# Patient Record
Sex: Female | Born: 1970 | Race: Black or African American | Hispanic: No | Marital: Married | Smoking: Never smoker
Health system: Southern US, Community
[De-identification: ages and names within clinical notes are randomized; demographics above are authoritative.]

## PROBLEM LIST (undated history)

## (undated) DIAGNOSIS — L309 Dermatitis, unspecified: Secondary | ICD-10-CM

## (undated) DIAGNOSIS — Z8639 Personal history of other endocrine, nutritional and metabolic disease: Secondary | ICD-10-CM

## (undated) HISTORY — PX: WISDOM TOOTH EXTRACTION: SHX21

## (undated) HISTORY — PX: HERNIA REPAIR: SHX51

---

## 2000-08-27 ENCOUNTER — Ambulatory Visit (HOSPITAL_COMMUNITY): Admission: RE | Admit: 2000-08-27 | Discharge: 2000-08-27 | Payer: Self-pay | Admitting: Obstetrics and Gynecology

## 2000-08-27 ENCOUNTER — Encounter: Payer: Self-pay | Admitting: Obstetrics and Gynecology

## 2000-11-15 ENCOUNTER — Encounter: Admission: RE | Admit: 2000-11-15 | Discharge: 2001-02-13 | Payer: Self-pay | Admitting: Obstetrics and Gynecology

## 2000-12-10 ENCOUNTER — Encounter (HOSPITAL_COMMUNITY): Admission: AD | Admit: 2000-12-10 | Discharge: 2001-01-09 | Payer: Self-pay | Admitting: Obstetrics and Gynecology

## 2001-01-16 ENCOUNTER — Encounter (HOSPITAL_COMMUNITY): Admission: RE | Admit: 2001-01-16 | Discharge: 2001-01-24 | Payer: Self-pay | Admitting: Obstetrics & Gynecology

## 2001-01-24 ENCOUNTER — Inpatient Hospital Stay (HOSPITAL_COMMUNITY): Admission: AD | Admit: 2001-01-24 | Discharge: 2001-01-27 | Payer: Self-pay | Admitting: Obstetrics and Gynecology

## 2001-03-10 ENCOUNTER — Other Ambulatory Visit: Admission: RE | Admit: 2001-03-10 | Discharge: 2001-03-10 | Payer: Self-pay | Admitting: Obstetrics and Gynecology

## 2002-03-12 ENCOUNTER — Other Ambulatory Visit: Admission: RE | Admit: 2002-03-12 | Discharge: 2002-03-12 | Payer: Self-pay | Admitting: Family Medicine

## 2003-02-16 ENCOUNTER — Other Ambulatory Visit: Admission: RE | Admit: 2003-02-16 | Discharge: 2003-02-16 | Payer: Self-pay | Admitting: Family Medicine

## 2005-03-28 ENCOUNTER — Other Ambulatory Visit: Admission: RE | Admit: 2005-03-28 | Discharge: 2005-03-28 | Payer: Self-pay | Admitting: Obstetrics and Gynecology

## 2005-06-18 ENCOUNTER — Encounter: Admission: RE | Admit: 2005-06-18 | Discharge: 2005-09-16 | Payer: Self-pay | Admitting: Obstetrics and Gynecology

## 2005-08-22 ENCOUNTER — Inpatient Hospital Stay (HOSPITAL_COMMUNITY): Admission: AD | Admit: 2005-08-22 | Discharge: 2005-08-24 | Payer: Self-pay | Admitting: Obstetrics and Gynecology

## 2005-10-13 ENCOUNTER — Inpatient Hospital Stay (HOSPITAL_COMMUNITY): Admission: AD | Admit: 2005-10-13 | Discharge: 2005-10-15 | Payer: Self-pay | Admitting: Obstetrics and Gynecology

## 2007-07-05 IMAGING — US US OB COMP +14 WK
1 series · 13 of 28 positions shown · non-contrast
Comparison: none

CLINICAL DATA: 34-year-old female.  31 weeks pregnant.  Pain.  Patient hurts when standing.

[Series 1: us ob comp +14 wk · 0.39mm/px · 37 acquisitions, 13 frames shown]
[im 2/37]
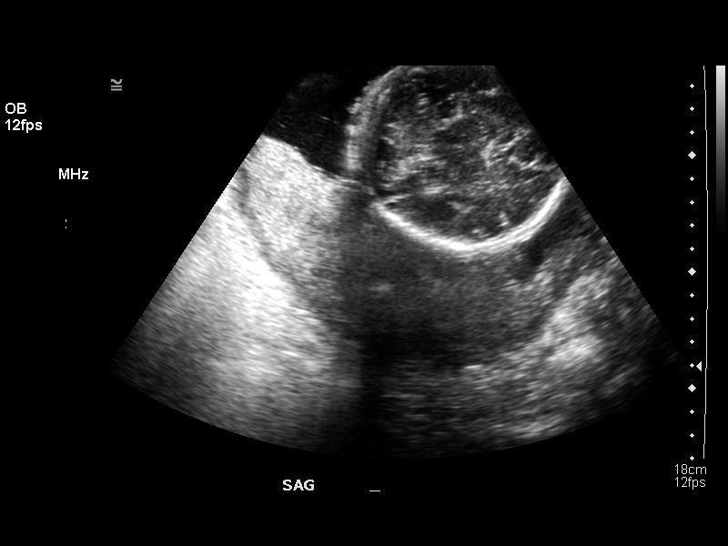
[im 5/37]
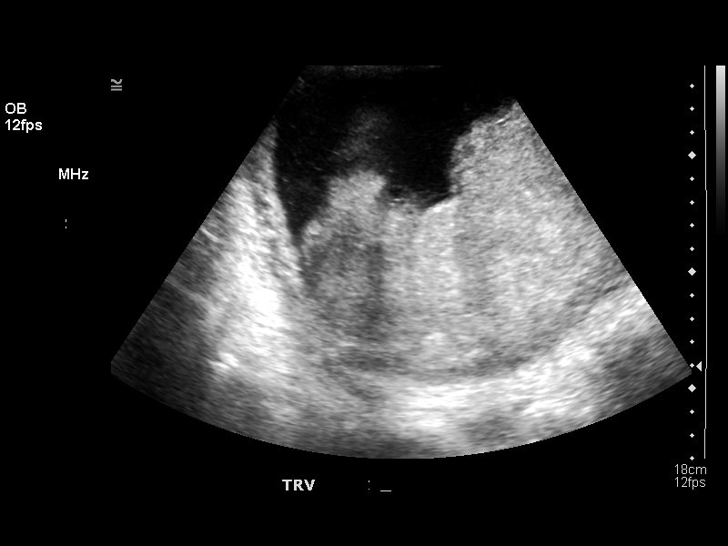
[im 7/37]
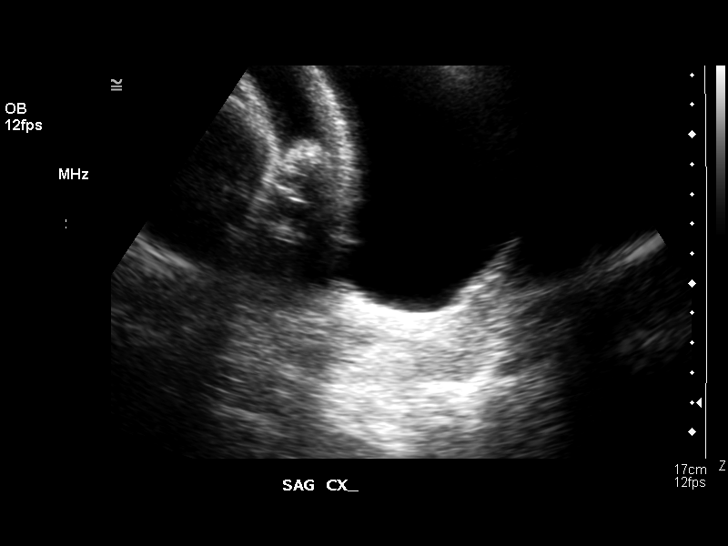
[im 10/37]
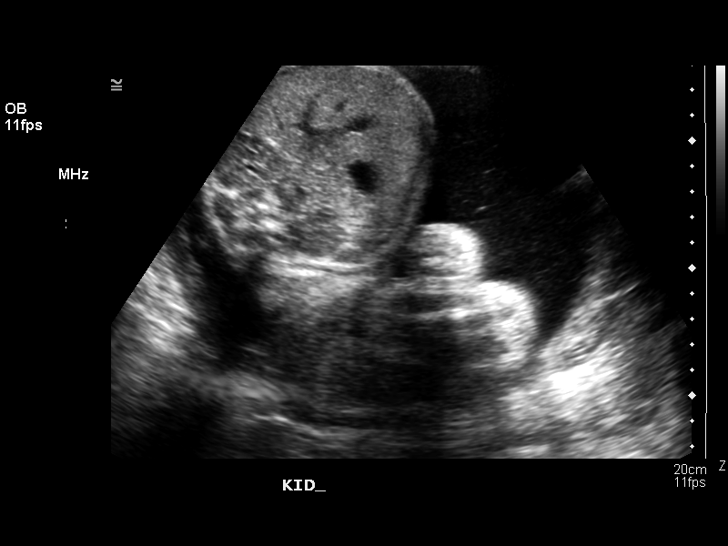
[im 13/37]
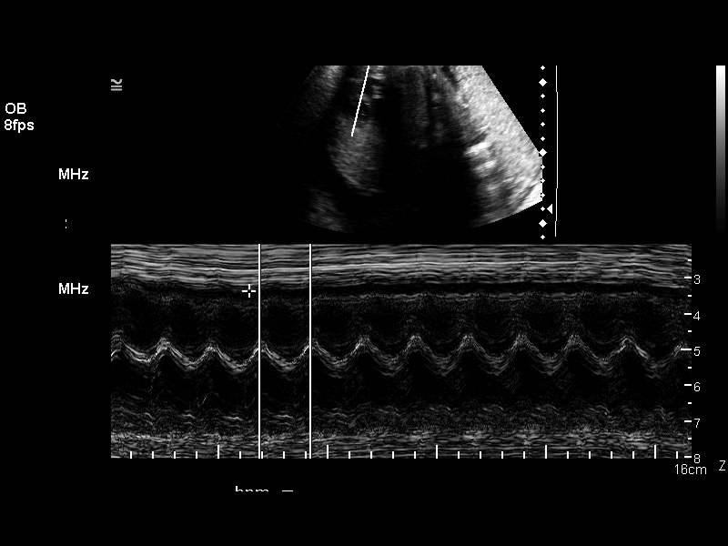
[im 15/37]
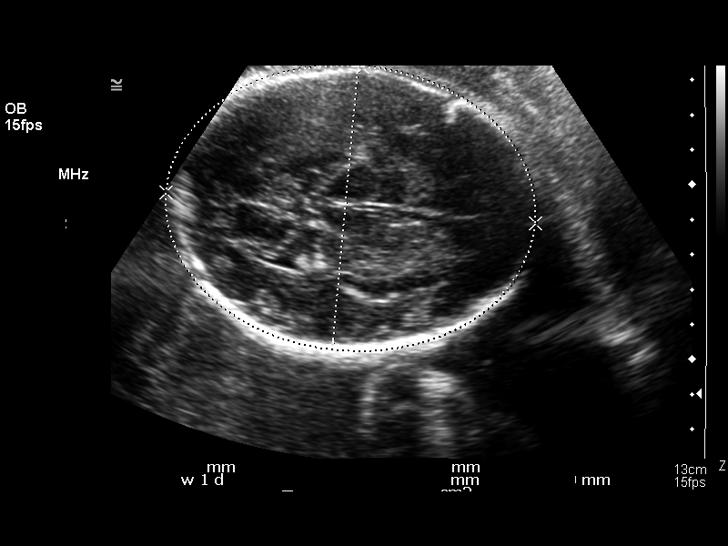
[im 19/37]
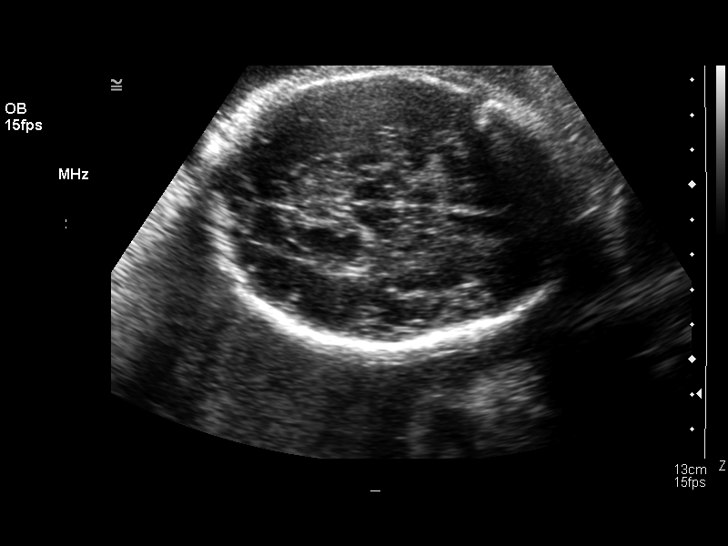
[im 22/37]
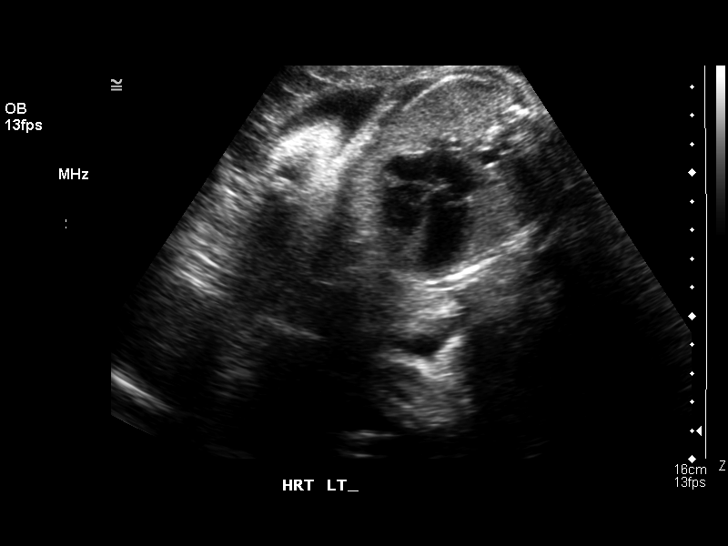
[im 25/37]
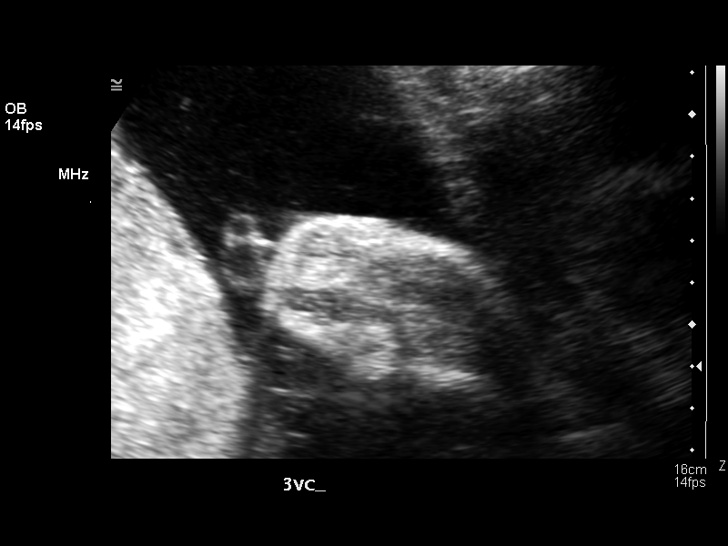
[im 27/37]
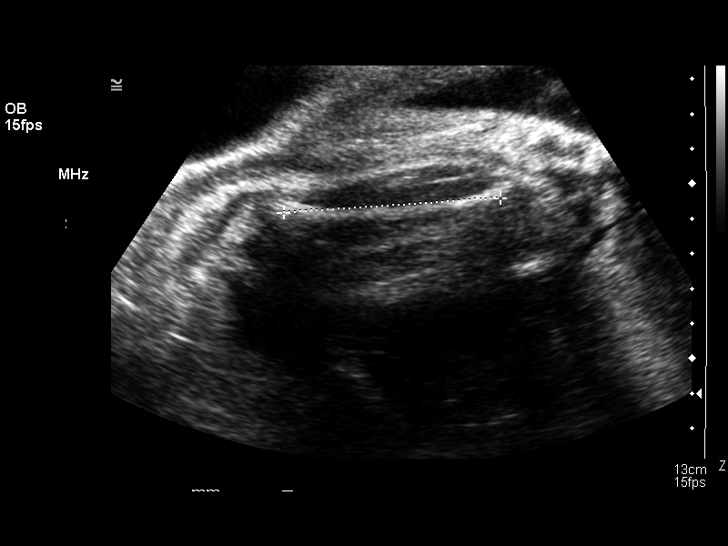
[im 30/37]
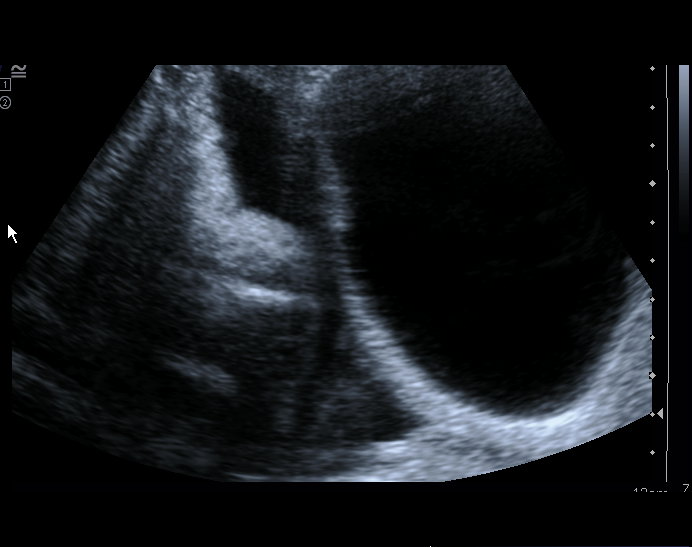
[im 33/37]
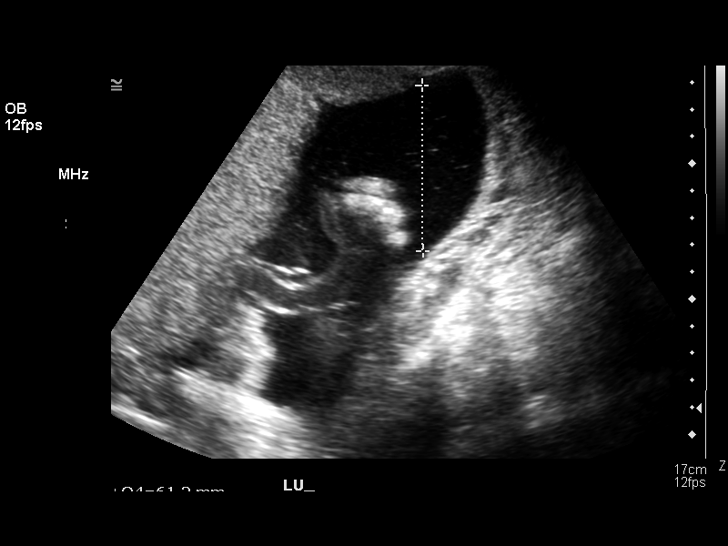
[im 35/37]
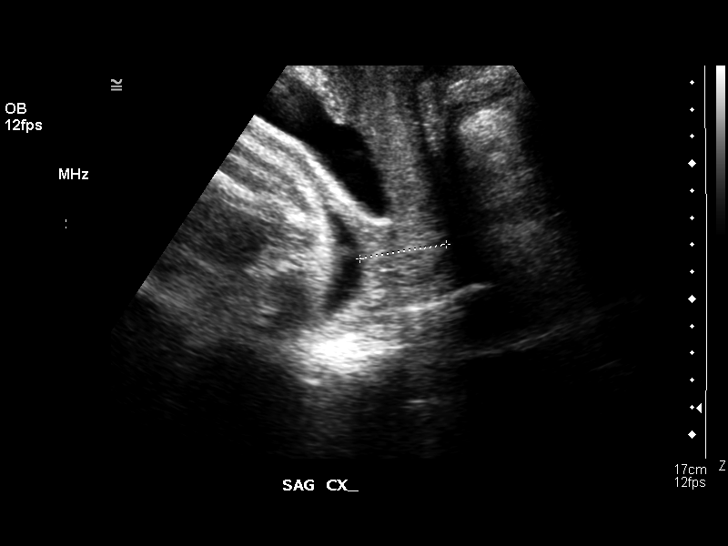

[13 of 28 positions shown; findings below may reference images not displayed]

OBSTETRICAL ULTRASOUND:
 Number of Fetuses: 1
 Heart Rate:  128
 Movement:  Yes
 Breathing:  Yes  
 Presentation:  Variable
 Placental Location:  Fundal, posterior
 Grade: I
 Previa:  No
 Amniotic Fluid (Subjective):  Normal 
 Amniotic Fluid (Objective):   15.7 cm AFI (5th -95th%ile = 8.6 ? 24.2 cm for 32 wks)

 FETAL BIOMETRY
 BPD:   7.9 cm  32 w 0 d
 HC:   29.4 cm  32 w 3 d
 AC:   28.8 cm   32 w 6 d
 FL:   6.2 cm  32 w 0 d

 MEAN GA:  32 w 2 d  US EDC:  10/16/05

 EFW:  5634 g (H) 75th ? 90th%ile (8022 ? 2970 g) For 32 wks

 FETAL ANATOMY
 Lateral Ventricles:    Visualized 
 Thalami/CSP:      Visualized 
 Posterior Fossa:  Visualized 
 Nuchal Region:    N/A
 Spine:      Not visualized 
 4 Chamber Heart on Left:      Visualized 
 Stomach on Left:      Visualized 
 3 Vessel Cord:    Visualized 
 Cord Insertion site:    Not visualized 
 Kidneys:  Visualized 
 Bladder:  Visualized 
 Extremities:      Not visualized 

 Evaluation limited by:  Fetal position and advanced gestational age.

 MATERNAL UTERINE AND ADNEXAL FINDINGS
 Cervix:   3.2 cm Translabially
IMPRESSION: 1.  Single intrauterine pregnancy with fetal heart rate of 128 bpm.  
 2.  Cervix is closed and AFI is within normal limits.

## 2009-02-02 ENCOUNTER — Encounter: Admission: RE | Admit: 2009-02-02 | Discharge: 2009-02-02 | Payer: Self-pay | Admitting: Obstetrics and Gynecology

## 2009-04-08 ENCOUNTER — Inpatient Hospital Stay (HOSPITAL_COMMUNITY): Admission: AD | Admit: 2009-04-08 | Discharge: 2009-04-08 | Payer: Self-pay | Admitting: Obstetrics and Gynecology

## 2009-04-22 ENCOUNTER — Ambulatory Visit (HOSPITAL_COMMUNITY): Admission: RE | Admit: 2009-04-22 | Discharge: 2009-04-22 | Payer: Self-pay | Admitting: Obstetrics and Gynecology

## 2009-05-26 ENCOUNTER — Encounter (INDEPENDENT_AMBULATORY_CARE_PROVIDER_SITE_OTHER): Payer: Self-pay | Admitting: Obstetrics and Gynecology

## 2009-05-26 ENCOUNTER — Inpatient Hospital Stay (HOSPITAL_COMMUNITY): Admission: RE | Admit: 2009-05-26 | Discharge: 2009-05-29 | Payer: Self-pay | Admitting: Obstetrics and Gynecology

## 2009-11-11 ENCOUNTER — Other Ambulatory Visit: Admission: RE | Admit: 2009-11-11 | Discharge: 2009-11-11 | Payer: Self-pay | Admitting: *Deleted

## 2010-06-11 ENCOUNTER — Encounter: Payer: Self-pay | Admitting: Obstetrics and Gynecology

## 2010-08-05 LAB — GLUCOSE, CAPILLARY
Glucose-Capillary: 109 mg/dL — ABNORMAL HIGH (ref 70–99)
Glucose-Capillary: 134 mg/dL — ABNORMAL HIGH (ref 70–99)
Glucose-Capillary: 147 mg/dL — ABNORMAL HIGH (ref 70–99)
Glucose-Capillary: 85 mg/dL (ref 70–99)

## 2010-08-05 LAB — CBC
HCT: 38.4 % (ref 36.0–46.0)
Hemoglobin: 12.9 g/dL (ref 12.0–15.0)
MCHC: 33.3 g/dL (ref 30.0–36.0)
MCHC: 33.6 g/dL (ref 30.0–36.0)
MCV: 94.1 fL (ref 78.0–100.0)
MCV: 94.8 fL (ref 78.0–100.0)
Platelets: ADEQUATE 10*3/uL (ref 150–400)
RBC: 4.08 MIL/uL (ref 3.87–5.11)
RDW: 14.2 % (ref 11.5–15.5)
RDW: 14.2 % (ref 11.5–15.5)
WBC: 8.4 10*3/uL (ref 4.0–10.5)

## 2010-08-05 LAB — GLUCOSE, RANDOM: Glucose, Bld: 83 mg/dL (ref 70–99)

## 2010-08-05 LAB — RPR: RPR Ser Ql: NONREACTIVE

## 2010-08-23 LAB — GC/CHLAMYDIA PROBE AMP, GENITAL: GC Probe Amp, Genital: NEGATIVE

## 2010-08-23 LAB — STREP B DNA PROBE

## 2010-08-23 LAB — WET PREP, GENITAL
Trich, Wet Prep: NONE SEEN
Yeast Wet Prep HPF POC: NONE SEEN

## 2010-08-23 LAB — URINALYSIS, ROUTINE W REFLEX MICROSCOPIC
Bilirubin Urine: NEGATIVE
Glucose, UA: 100 mg/dL — AB
Hgb urine dipstick: NEGATIVE
Protein, ur: NEGATIVE mg/dL
Urobilinogen, UA: 0.2 mg/dL (ref 0.0–1.0)

## 2010-10-06 NOTE — Discharge Summary (Signed)
[name], [name]              ACCOUNT NO.:  [id number]   MEDICAL RECORD NO.:  [id number]          PATIENT TYPE:  INP   LOCATION:  9153                          FACILITY:  [facility]   PHYSICIAN:  Naima A. Dillard, M.D. DATE OF BIRTH:  08-15-1970   DATE OF ADMISSION:  08/22/2005  DATE OF DISCHARGE:  08/24/2005                                 DISCHARGE SUMMARY   ADMISSION DIAGNOSES:  1.  Intrauterine pregnancy at 31-6/7th weeks.  2.  Preterm contractions.  3.  Gestational diabetes, diet controlled.   DISCHARGE DIAGNOSES:  1.  Intrauterine pregnancy at 32-1/7th weeks.  2.  Preterm contractions, resolved.  3.  Gestational diabetes, now on insulin.   PROCEDURE:  Fetal heart rate monitoring, magnesium tocolysis.   HOSPITAL COURSE:  Brooke [name] is a 40 year old, gravida 2, para 1-0-0-1, who  presented with complaints of abdominal cramping on August 22, 2005.  At the  time of evaluation in maternity admissions, the patient was noted to be  having contractions every three minutes.  The patient was given terbutaline  without resolution of contractions.  Fetal fibronectin obtained in MAU was  negative.  The patient also received Procardia in MAU for preterm  contractions with little change in contraction pattern.  The patient's  cervix was long, closed and posterior on admission.  Due to continued  contractions despite terbutaline and Procardia, the patient was placed on  magnesium tocolysis.  Contractions resolved with magnesium tocolysis.  Fetal  heart rate status remained reassuring throughout her hospital stay.  The  patient was given a course of antenatal steroids throughout her  hospitalization.  The patient's blood sugars in the 170s to 190s after  steroids.  Therefore, the patient was started on insulin during her hospital  course.   Cervix remained long.  Ultrasound obtained during her hospital stay revealed  normal amniotic fluid, estimated fetal weight in the 75th to 90th  percentile.  Cervix closed 3.2 cm and transverse lie.  The patient's group B  strep obtained on admission pending.  Gonorrhea and Chlamydia cultures  obtained on admission were both negative.   The patient was weaned from magnesium with no recurrence of contractions on  August 24, 2005.  The patient was discharged home after discontinuation of  magnesium per tocolysis.  The patient's pregnancy has been remarkable for 1)  gestational diabetes, diet control; 2) advanced maternal age with the  patient declining gestational testing.   CONDITION ON DISCHARGE:  Stable.   DISCHARGE INSTRUCTIONS:  The patient is to be on modified bed rest at home.  The patient was given routine preterm labor precautions.   DISCHARGE MEDICATIONS:  1.  Prenatal vitamins daily.  2.  NovoLog insulin sliding scale.  The patient was given complete      instructions on insulin self-administration prior to discharge.   FOLLOW UP:  Discharge follow up with occur next week at [facility].  The patient is to call for an appointment.      Rhona Leavens, CNM      Naima A. Normand Sloop, M.D.  Electronically Signed  NOS/MEDQ  D:  08/25/2005  T:  08/25/2005  Job:  [id number]

## 2010-10-06 NOTE — H&P (Signed)
Brooke [name], Brooke [name]              ACCOUNT NO.:  [id number]   MEDICAL RECORD NO.:  [id number]          PATIENT TYPE:  MAT   LOCATION:  MATC                          [facility]:  [facility]   PHYSICIAN:  Naima A. Dillard, M.D. DATE OF BIRTH:  [birth year]   DATE OF ADMISSION:  08/22/2005  DATE OF DISCHARGE:                                HISTORY & PHYSICAL   Brooke [name] is a 40 year old gravida 2, para 1-0-0-1, at 69 6/7 weeks who  presented complaining of a tightness in her abdomen since approximately 1  p.m.  She denied vaginal bleeding, discharge, dysuria, nausea and vomiting,  or other symptoms.  She denies any recent intercourse.  Her pregnancy has  been remarkable for:  1.  Gestational diabetes diet controlled.  2.  Advanced maternal age with patient declining amniocentesis.   While at maternity admissions, the patient was noted to be contracting every  2-3 minutes initially.  Her cervix was long and closed.  Her fetal  fibronectin was negative, however, despite two doses of Terbutaline, the  patient continued to contract every 3-4 minutes.  Contractions were of mild  to moderate intensity.  The patient was, therefore, admitted for further  monitoring, Procardia 10 mg p.o. q.6h., and IV hydration.   Prenatal labs revealed blood type is A positive, RH antibody negative, VDRL  nonreactive, Rubella titer positive, hepatitis B surface antigen negative,  HIV negative, GC Chlamydia cultures were negative, Pap was normal.  Hemoglobin upon entry to the practice was 13.3.  She had a normal quadruple  screen.  She had an elevated Glucola and was diagnosed with gestational  diabetes.  She has been maintaining blood sugars at fasting less than 90 and  less than 130.   HISTORY OF PRESENT PREGNANCY:  Patient entered care at approximately 10  weeks.  She declined first trimester screening.  She initially declined  quadruple screening but then did have that.  She had an ultrasound at 18  weeks which  revealed normal growth and development except for three cord  plexus cyst, quadruple screening was normal, Glucola was given at 18 weeks  due to an elevation.  She also failed a three hour GGT.  Her fastings have  all remained within normal limits.  She had another ultrasound at 29 weeks  showing normal growth and development.  She had some bright red bleeding at  approximately 21 weeks.  The rest of her pregnancy has been uncomplicated.   OBSTETRICAL HISTORY:  In 2002, she had a vaginal birth of a female infant,  weight 6 pounds 12 ounces at 40 weeks.  She had epidural for labor.  She did  have gestational diabetes diet controlled during that pregnancy.   MEDICAL HISTORY:  She has not used any contraception since the birth of her  last child.  She reports the usual childhood illnesses.   ALLERGIES:  She has no known drug allergies.   FAMILY HISTORY:  Her maternal aunts x 2 had hypertension.  Maternal aunt  also had heart disease.  Her mother and maternal aunt had diabetes.   Her only other hospitalization  is for child birth.  Genetic history is  unremarkable except for advanced maternal age with the patient going to be  35 at the time of the baby's birth.   SOCIAL HISTORY:  The patient is married to the father of the baby, he is  involved and supportive, his name is Justa Hatchell.  The patient is high  school educated, she is a Futures trader.  Her husband is a Emergency planning/management officer with  [employer].  He has two years of college.  She has been followed by the  physician service at [facility].  She denies any alcohol, drug, or  tobacco use during this pregnancy.   PHYSICAL EXAMINATION:  VITAL SIGNS:  Stable, the patient is afebrile.  HEENT:  Within normal limits.  LUNGS:  Breath sounds are clear.  HEART:  Regular rate and rhythm without murmur.  BREASTS:  Soft and nontender.  ABDOMEN:  Fundal height is approximately 21 cm, uterine contractions every 3-  4 minutes, mild to moderate  quality.  PELVIC:  Cervical exam is closed, long, vertex is -2, vertex presentation is  verified by bedside ultrasound.  Wet prep is negative.  Chem-9 was negative.  GC Chlamydia, and group B strep were also done and are pending.  Fetal heart  rate is reactive with a baseline of 140 to 150 with accelerations noted.  EXTREMITIES:  Deep tendon reflexes are 2+ without clonus, there is a trace  edema noted.   Fasting blood sugar per patient report at 4:30 this afternoon was 115.   IMPRESSION:  1.  Intrauterine pregnancy at 31 6/7 weeks.  2.  Preterm labor with negative feto fibronectin.  3.  Gestational diabetes, diet controlled.   PLAN:  1.  Admit to [facility] per consult with Dr. Jaymes Graff, attending physician.  2.  Routine physician antenatal orders.  3.  Continuous electronic fetal monitoring.  4.  Fasting blood sugar and 2 hour PC.  5.  LR at 125 mL per hour after a 200 mL bolus.  6.  Procardia 10 mg p.o. q.6h.  7.  Carbohydrate restricted diet.  8.  Ambien 10 mg 1 p.o. q.h.s.  9.  MD to be notified with blood sugars greater than 130.      Renaldo Reel Emilee Hero, C.N.M.      Naima A. Normand Sloop, M.D.  Electronically Signed    VLL/MEDQ  D:  08/22/2005  T:  08/22/2005  Job:  [id number]

## 2010-10-06 NOTE — H&P (Signed)
Brooke [name], Brooke [name]              ACCOUNT NO.:  [id number]   MEDICAL RECORD NO.:  [id number]           PATIENT TYPE:   LOCATION:                                [facility]ILITY:  [facility]   PHYSICIAN:  Hal Morales, M.D.     DATE OF BIRTH:   DATE OF ADMISSION:  DATE OF DISCHARGE:                                HISTORY & PHYSICAL   DATE OF ADMISSION:  Oct 13, 2005   Brooke [name] is a 40 year old gravida 2, para 1-0-0-1 at 66 and two-sevenths  weeks who presents today for induction secondary to gestational diabetes,  diet controlled.  The pregnancy has been remarkable for:  1.  Gestational diabetes, diet controlled.  2.  Advanced maternal age with amniocentesis declined, normal quadruple      screen.   PRENATAL LABORATORY DATA:  Blood type is A positive, Rh antibody negative.  VDRL nonreactive.  Rubella titer positive.  Hepatitis B surface antigen  negative.  HIV nonreactive.  GC and chlamydia cultures were negative at the  first trimester and at 36 weeks.  Pap smear was normal.  Group B strep  culture was negative at [redacted] weeks along with GC and chlamydia.  Hemoglobin  upon entering the practice was 13.3; it was within normal limits at Glucola  visit.  EDC of Oct 18, 2005, was established by last menstrual period and  was in agreement with ultrasound at 18 weeks.   HISTORY OF PRESENT ILLNESS:  The patient entered care at approximately 10  weeks.  She declined amniocentesis.  She was considering tubal sterilization  at that time.  Quadruple screen was declined.  Glucola was given at 18  weeks.  She then decided to have the quadruple screen which was normal.  She  had an ultrasound at that time showing normal growth and development except  for choroid plexus cyst x2.  Cervix was within normal limits.  At 21 week  she had a Dextrostix that was elevated.  She had an ultrasound showing  normal fluid.  She had a small amount of bleeding at that time.  She was  offered monitoring for contractions  in maternity admissions unit.  The  patient preferred to go home.  She was seen the following day with no  further bleeding.  She did fail the 3-hour GTT that followed from her  elevated 1-hour GTT.  [facility].  CBGs on dietary management were within normal limits.  She had an ultrasound  at 29 weeks showing normal growth and development with normal fluid, growth  at the 42nd percentile.  Her capillary  blood glucose remained within normal  limits.  She had another episode of questionable spotting at 30 weeks.  She  was evaluated for this subsequently.  She did have a nabothian cyst noted  but the cervix was long and close without any evidence of bleeding.  She  then began to decide that she preferred Mirena postpartum versus a tubal.  At 31 weeks she began to have some slight elevations of her 2-hour  postprandial blood sugars.  She  also had mild thrombocytopenia.  This was  followed up again at 36 weeks.  Her platelets were 131.  Group B strep  culture was done at that time and was negative.  She had another ultrasound  at 34 weeks showing 75th to 90th percentile with a variable presentation.  She was placed on a sliding scale but rarely had to use the insulin at all  and in fact, by 35 weeks was not having to use it at all.  She did receive  betamethasone in the hospital when she was hospitalized for preterm labor at  31 weeks.  The rest of her pregnancy was essentially uncomplicated.   OBSTETRICAL HISTORY:  In 2002 the patient had a vaginal birth of a female  infant, weight 6 pounds 12 ounces at 40 weeks.  She was in labor 12 hours.  She had epidural anesthesia.  She did have gestational diabetes during that  pregnancy and was managed with dietary control.   MEDICAL HISTORY:  She had not use any contraception since her last child.  She reports the usual childhood illnesses.   SURGICAL HISTORY:  Includes her wisdom teeth removed at age 50.  Her  only  other hospitalization was for childbirth.   FAMILY HISTORY:  Maternal aunt had heart disease.  Maternal aunts x2 had  hypertension.  Her mother and two maternal aunts had diabetes.   GENETIC HISTORY:  Remarkable for advanced maternal age with the patient  declining amniocentesis but had a normal quadruple screen.   SOCIAL HISTORY:  The patient is married to the father of the baby.  He is  involved and supportive.  His name is Brooke [name].  The patient is high-  school educated; she is a Futures trader.  Her husband has 2 years of college.  He is Emergency planning/management officer with [employer].  She has been followed by the  physician service at [facility].  She denies any alcohol, drug, or  tobacco use during this pregnancy.   PHYSICAL EXAMINATION:  VITAL SIGNS:  Stable, the patient is afebrile.  HEENT:  Within normal limits.  LUNGS:  Bilateral breath sounds are clear.  HEART:  Regular rate and rhythm without murmur.  BREASTS:  Soft and nontender.  ABDOMEN:  Fundal height is approximately 39 cm.  Estimated fetal weight is 7-  8 pounds.  Uterine contractions are very occasional and mild.  Fetal heart  rate has been reactive on NSTs in the office.  PELVIC:  Exam is deferred at this time.  This will be performed by the nurse  or by Dr. Pennie Rushing prior to induction.  EXTREMITIES:  Deep tendon reflexes are 2+ without clonus.  There is a trace  edema noted.   IMPRESSION:  1.  Intrauterine pregnancy at 40 and two-sevenths weeks.  2.  Gestational diabetes.  3.  Negative group B streptococcus.   PLAN:  1.  Admit to birthing suite per consult with Dr. Dierdre Forth as      attending physician.  2.  Routine physician orders.  3.  Dr. Pennie Rushing will determine the plan of induction for this patient.      Renaldo Reel Brooke [name], C.N.M.      Hal Morales, M.D.  Electronically Signed    VLL/MEDQ  D:  10/12/2005  T:  10/12/2005  Job:  [id number]

## 2010-10-06 NOTE — Discharge Summary (Signed)
[facility]  Patient:    Brooke [name], Brooke [name] Visit Number: [id number] MRN: [id number]          Service Type: OBS Location: [id number] Attending Physician:  Oliver Pila Dictated by:   Alvino Chapel, M.D. Admit Date:  01/24/2001 Discharge Date: 01/27/2001                             Discharge Summary  DISCHARGE DIAGNOSES:          1. Term pregnancy at 39+ weeks.                               2. A1 diabetes mellitus.                               3. Meconium-stained fluid.                               4. Shoulder dystocia.                               5. Vacuum-assisted vaginal delivery.  DISCHARGE MEDICATIONS:        Motrin 600 mg p.o. every six hours p.r.n.  DISCHARGE FOLLOWUP:           The patient is to follow up in approximately six weeks for her routine postpartum exam.  REASON FOR ADMISSION:         The patient is a 40 year old, G1, P0, who was admitted at 39 weeks after she presented to labor and delivery with vaginal bleeding, passing one small clot and period-like flow. The patient, on admission, did have contractions every three minutes, fetal heart rate was reactive and cervical exam revealed a cervix that was 70%, 1+ cm dilated, and a -2 station. Given the vaginal bleeding, the patient was admitted for augmentation of labor and observation of fetal heart rate. Pregnancy had been complicated by A1 diabetes which was well controlled and otherwise was normal.   PRENATAL LABORATORY DATA:     A positive, antibody negative, sickle negative, RPR nonreactive, rubella immune, hepatitis B surface antigen negative, HIV negative, GC negative, Chlamydia negative, GBS negative.  PAST OBSTETRICAL HISTORY:     None.  PAST GYNECOLOGICAL HISTORY:   No abnormal Pap smears.  PAST SURGICAL HISTORY:        Wisdom tooth extraction.  PAST MEDICAL HISTORY:         None.  MEDICATIONS:                  None.  ALLERGIES:                     No known drug allergies.  SOCIAL HISTORY:               The patient is married and a nonsmoker.  HOSPITAL COURSE:              On admission, she was afebrile with stable vital signs. Fetal heart rate was reactive and she was contracting every two to three minutes. There was some light bleeding noted from the vagina, however, ultrasound confirmed that the placenta was well away from the cervical os. The patient then  had assisted rupture of membranes with moderate meconium noted. An amnioinfusion was begun and the patient was placed on low-dose Pitocin. She then progressed well and reached complete dilation with fetal head noted to be in an OP presentation. She began pushing when the head reached a +2 to +3 station and was slightly transverse. The fetal heart rate began to have prolonged decelerations to the 60s to 70s with slow recovery, and the fetal head was rotating between a transverse and an OA position. Given the nonreassuring tracing, the decision was made to proceed with a vacuum-assisted delivery. Vacuum was applied and the vertex was brought to crowning with the vacuum then removed and the remainder of the head delivered by the mother. The head had rotated to OA on delivery but the anterior shoulder remained transverse. A resulting shoulder dystocia was relieved with some difficulty after about four minutes. Woodscrew maneuver and McRoberts, episiotomy, and suprapubic were attempted but the shoulder was difficult to move secondary to its transverse position. A nuchal cord x 1 was reduced and delivery was effected by posterior arm release. Peds was present and the baby was initially floppy but responded to blow by oxygen quickly. Apgars were 2, 6, and 8. Weight was 6 pounds 12 ounces, viable female infant. Cord pH was 7.26. The placenta delivered spontaneously, three-vessel cord. The episiotomy had no extension and was repaired with 2-0 Vicryl. Cervix and rectum were intact  and the estimated blood loss was 350 cc. The baby was placed in the observation nursery for the first two to three hours of life, however, did very well and was able to come out quickly.  On postpartum day #2 the patient was doing well. She had no complaints. She was afebrile with stable vital signs. Her bleeding was normal, therefore, she was felt stable for discharge and was discharged to home with followup as previously stated.    Dictated by:   Alvino Chapel, M.D. Attending Physician:  Oliver Pila DD:  01/27/01 TD:  01/27/01 Job: [id number]

## 2011-11-23 ENCOUNTER — Other Ambulatory Visit: Payer: Self-pay | Admitting: Family Medicine

## 2011-11-23 ENCOUNTER — Other Ambulatory Visit (HOSPITAL_COMMUNITY)
Admission: RE | Admit: 2011-11-23 | Discharge: 2011-11-23 | Disposition: A | Discharge status: Home / Self Care | Payer: 59 | Source: Ambulatory Visit | Attending: Family Medicine | Admitting: Family Medicine

## 2011-11-23 DIAGNOSIS — Z0000 Encounter for general adult medical examination without abnormal findings: Secondary | ICD-10-CM | POA: Insufficient documentation

## 2011-11-23 DIAGNOSIS — R1900 Intra-abdominal and pelvic swelling, mass and lump, unspecified site: Secondary | ICD-10-CM

## 2011-11-27 ENCOUNTER — Ambulatory Visit
Admission: RE | Admit: 2011-11-27 | Discharge: 2011-11-27 | Disposition: A | Discharge status: Home / Self Care | Payer: 59 | Source: Ambulatory Visit | Attending: Family Medicine | Admitting: Family Medicine

## 2011-11-27 DIAGNOSIS — R1900 Intra-abdominal and pelvic swelling, mass and lump, unspecified site: Secondary | ICD-10-CM

## 2012-09-18 HISTORY — PX: MOUTH SURGERY: SHX715

## 2013-02-12 ENCOUNTER — Ambulatory Visit (INDEPENDENT_AMBULATORY_CARE_PROVIDER_SITE_OTHER): Payer: 59 | Admitting: General Surgery

## 2013-02-19 ENCOUNTER — Encounter (INDEPENDENT_AMBULATORY_CARE_PROVIDER_SITE_OTHER): Payer: Self-pay | Admitting: General Surgery

## 2013-02-19 ENCOUNTER — Ambulatory Visit (INDEPENDENT_AMBULATORY_CARE_PROVIDER_SITE_OTHER): Payer: 59 | Admitting: General Surgery

## 2013-02-19 VITALS — BP 130/76 | HR 84 | Temp 98.2°F | Resp 14 | Ht 62.0 in | Wt 123.2 lb

## 2013-02-19 DIAGNOSIS — M6208 Separation of muscle (nontraumatic), other site: Secondary | ICD-10-CM

## 2013-02-19 DIAGNOSIS — K439 Ventral hernia without obstruction or gangrene: Secondary | ICD-10-CM

## 2013-02-19 DIAGNOSIS — M6200 Separation of muscle (nontraumatic), unspecified site: Secondary | ICD-10-CM

## 2013-02-19 NOTE — Progress Notes (Signed)
Subjective:   Abdominal hernia and swelling and pain  Patient ID: Brooke [name], female   DOB: April 06, 1971, 42 y.o.   MRN: [id number]  HPI Patient is a pleasant 42 year old female who returns to the office for consideration for treatment of an abdominal hernia. She was initially seen here in 2011 with a discrete umbilical hernia but also a significant diastases and general double protuberance. It was suggested that she talk to a plastic surgeon about a combined procedure with abdominoplasty and repair of umbilical hernia. She did do this but the cost was prohibitive and she is unable to do this. She is experiencing gradually increasing pain at her umbilicus related to physical activity. Also some increasing general abdominal protuberance or distention. She feels the symptoms are gradually worsening. No nausea or vomiting. Bowel movements are okay. She has had a previous C-section through a Pfannenstiel incision and tubal ligation at that time but no upper abdominal incisions.  History reviewed. No pertinent past medical history. Past Surgical History  Procedure Laterality Date  . Mouth surgery  09/2012    mass removed  . Cesarean section  [id number]  . Wisdom tooth extraction     Current Outpatient Prescriptions  Medication Sig Dispense Refill  . Cholecalciferol (VITAMIN D) 2000 UNITS tablet Take 2,000 Units by mouth daily.       No current facility-administered medications for this visit.   No Known Allergies History  Substance Use Topics  . Smoking status: Never Smoker   . Smokeless tobacco: Never Used  . Alcohol Use: No     Review of Systems  Constitutional: Negative.   Respiratory: Negative.   Cardiovascular: Negative.   Gastrointestinal: Positive for abdominal pain and abdominal distention. Negative for nausea, vomiting, blood in stool and anal bleeding.       Objective:   Physical Exam BP 130/76  Pulse 84  Temp(Src) 98.2 F (36.8 C) (Temporal)  Resp 14  Ht 5\' 2"  (1.575 m)   Wt 123 lb 3.2 oz (55.883 kg)  BMI 22.53 kg/m2 General: Alert, well-developed [place] American female, in no distress Skin: Warm and dry without rash or infection. HEENT: No palpable masses or thyromegaly. Sclera nonicteric. Pupils equal round and reactive. Oropharynx clear. Lymph nodes: No cervical, supraclavicular, or inguinal nodes palpable. Lungs: Breath sounds clear and equal without increased work of breathing Cardiovascular: Regular rate and rhythm without murmur. No JVD or edema. Peripheral pulses intact. Abdomen:With the patient standing there is very marked general laxity of the abdominal wall particularly for her small size and also a discrete 3-4 cm slightly tender reducible umbilical hernia. With the patient lying down and doing a sit up maneuver there is a very marked diastasis extending from the subxiphoid to the lower abdomen. Extremities: No edema or joint swelling or deformity. No chronic venous stasis changes. Neurologic: Alert and fully oriented. Gait normal.    Assessment:     Ventral hernia with both an increasingly symptomatic discreet umbilical hernia but also a very marked diastases and laxity of her abdominal wall. I will discuss with her about treatment options. She is unable to have a formal abdominoplasty due to cost issues with plastic surgery. I told her we could simply repair her umbilical hernia but very concerned that with the marked thinning of her abdominal wall and diastases in this area she would be highly prone to recurrence. She has significant excess skin in her mid abdominal area as well from pregnancies. I believe the most definitive treatment for her would  be a panniculectomy with a transverse incision with repair of her abdominal wall bringing the rectus muscles back to midline and reinforcing with mesh. This could be placed in the pre-peritoneal space. We discussed these 2 options and pros and cons in detail. She would prefer the more extensive repair  which although is a longer recovery more extensive surgery on the cough is much more chance of long-term success. We discussed that she would be hospitalized for at least 2 or 3 days and we discussed risks of bleeding, infection, recurrence and some chance of chronic pain secondary to mesh. All her questions were answered.     Plan:     Repair of abdominal hernia and diastases with panniculectomy under general anesthesia.

## 2013-04-30 ENCOUNTER — Other Ambulatory Visit: Payer: Self-pay | Admitting: Family Medicine

## 2013-04-30 DIAGNOSIS — R109 Unspecified abdominal pain: Secondary | ICD-10-CM

## 2013-05-04 ENCOUNTER — Ambulatory Visit
Admission: RE | Admit: 2013-05-04 | Discharge: 2013-05-04 | Disposition: A | Discharge status: Home / Self Care | Payer: 59 | Source: Ambulatory Visit | Attending: Family Medicine | Admitting: Family Medicine

## 2013-05-04 DIAGNOSIS — R109 Unspecified abdominal pain: Secondary | ICD-10-CM

## 2013-05-05 ENCOUNTER — Telehealth (INDEPENDENT_AMBULATORY_CARE_PROVIDER_SITE_OTHER): Payer: Self-pay | Admitting: General Surgery

## 2013-05-05 NOTE — Telephone Encounter (Signed)
Panniculectomy is an integral part of the procedure.  We will do it and charge for open [facility] repair.  She will be inpatient.  I would like to see her in the office for a pre op visit

## 2013-05-05 NOTE — Telephone Encounter (Signed)
Brooke [name] will have surgery 05/26/13   Her [facility] insurance denied her panniculectomy. She will not have that portion.  Does she still need to stay inpatient status?  Do you need to see her back in the office?  Please change orders to not include panniculectomy Thanks

## 2013-05-08 ENCOUNTER — Telehealth (INDEPENDENT_AMBULATORY_CARE_PROVIDER_SITE_OTHER): Payer: Self-pay | Admitting: General Surgery

## 2013-05-08 NOTE — Telephone Encounter (Signed)
Per note pt needs OV to see Dr Johna Sheriff before her surgery 05/26/13  Cell [phone]

## 2013-05-13 ENCOUNTER — Encounter (HOSPITAL_COMMUNITY): Payer: Self-pay | Admitting: Pharmacy Technician

## 2013-05-22 ENCOUNTER — Encounter (INDEPENDENT_AMBULATORY_CARE_PROVIDER_SITE_OTHER): Payer: Self-pay | Admitting: General Surgery

## 2013-05-22 ENCOUNTER — Other Ambulatory Visit (INDEPENDENT_AMBULATORY_CARE_PROVIDER_SITE_OTHER): Payer: Self-pay | Admitting: General Surgery

## 2013-05-22 ENCOUNTER — Encounter (HOSPITAL_COMMUNITY): Payer: Self-pay

## 2013-05-22 ENCOUNTER — Encounter (HOSPITAL_COMMUNITY)
Admission: RE | Admit: 2013-05-22 | Discharge: 2013-05-22 | Disposition: A | Discharge status: Home / Self Care | Payer: 59 | Source: Ambulatory Visit | Attending: General Surgery | Admitting: General Surgery

## 2013-05-22 ENCOUNTER — Ambulatory Visit (INDEPENDENT_AMBULATORY_CARE_PROVIDER_SITE_OTHER): Payer: 59 | Admitting: General Surgery

## 2013-05-22 VITALS — BP 114/60 | HR 72 | Temp 98.1°F | Resp 14 | Ht 61.0 in | Wt 126.2 lb

## 2013-05-22 DIAGNOSIS — Z01812 Encounter for preprocedural laboratory examination: Secondary | ICD-10-CM | POA: Insufficient documentation

## 2013-05-22 DIAGNOSIS — Z01818 Encounter for other preprocedural examination: Secondary | ICD-10-CM | POA: Insufficient documentation

## 2013-05-22 DIAGNOSIS — K439 Ventral hernia without obstruction or gangrene: Secondary | ICD-10-CM

## 2013-05-22 DIAGNOSIS — M6208 Separation of muscle (nontraumatic), other site: Secondary | ICD-10-CM

## 2013-05-22 DIAGNOSIS — M6200 Separation of muscle (nontraumatic), unspecified site: Secondary | ICD-10-CM

## 2013-05-22 HISTORY — DX: Dermatitis, unspecified: L30.9

## 2013-05-22 HISTORY — DX: Personal history of other endocrine, nutritional and metabolic disease: Z86.39

## 2013-05-22 LAB — BASIC METABOLIC PANEL
BUN: 14 mg/dL (ref 6–23)
CALCIUM: 9 mg/dL (ref 8.4–10.5)
CO2: 26 mEq/L (ref 19–32)
CREATININE: 0.64 mg/dL (ref 0.50–1.10)
Chloride: 100 mEq/L (ref 96–112)
GFR calc Af Amer: >90 mL/min (ref >90)
GFR calc non Af Amer: >90 mL/min (ref >90)
Glucose, Bld: 104 mg/dL — ABNORMAL HIGH (ref 70–99)
Potassium: 4.1 mEq/L (ref 3.7–5.3)
Sodium: 136 mEq/L — ABNORMAL LOW (ref 137–147)

## 2013-05-22 LAB — HCG, SERUM, QUALITATIVE: Preg, Serum: NEGATIVE

## 2013-05-22 LAB — CBC
HEMATOCRIT: 37.2 % (ref 36.0–46.0)
Hemoglobin: 13 g/dL (ref 12.0–15.0)
MCH: 31.2 pg (ref 26.0–34.0)
MCHC: 34.9 g/dL (ref 30.0–36.0)
MCV: 89.2 fL (ref 78.0–100.0)
Platelets: 206 10*3/uL (ref 150–400)
RBC: 4.17 MIL/uL (ref 3.87–5.11)
RDW: 11.7 % (ref 11.5–15.5)
WBC: 4.8 10*3/uL (ref 4.0–10.5)

## 2013-05-22 NOTE — Patient Instructions (Signed)
Brooke [name]  05/22/2013                           YOUR PROCEDURE IS SCHEDULED ON: 05/26/13               PLEASE REPORT TO SHORT STAY CENTER AT : 8:15 AM               CALL THIS NUMBER IF ANY PROBLEMS THE DAY OF SURGERY :               [phone]                      REMEMBER:   Do not eat food or drink liquids AFTER MIDNIGHT  May have clear liquids UNTIL 6 HOURS BEFORE SURGERY  Clear liquids include soda, tea, black coffee, apple or grape juice, broth.  Take these medicines the morning of surgery with A SIP OF WATER: NONE   Do not wear jewelry, make-up   Do not wear lotions, powders, or perfumes.   Do not shave legs or underarms 12 hrs. before surgery (men may shave face)  Do not bring valuables to the hospital.  Contacts, dentures or bridgework may not be worn into surgery.  Leave suitcase in the car. After surgery it may be brought to your room.  For patients admitted to the hospital more than one night, checkout time is 11:00                          The day of discharge.   Patients discharged the day of surgery will not be allowed to drive home                             If going home same day of surgery, must have someone stay with you first                           24 hrs at home and arrange for some one to drive you home from hospital.    Special Instructions:   Please read over the following fact sheets that you were given:                                    1. [place]                                                X_____________________________________________________________________        Failure to follow these instructions may result in cancellation of your surgery

## 2013-05-22 NOTE — Progress Notes (Signed)
Chief complaint: Abdominal hernia  History: Patient returns to the office for a preop visit prior to planned ventral double hernia repair. Please see my previous note for details. She has a discrete umbilical hernia but also a very large diastases in general abdominal wall laxity. For multiple reasons detailed in previous notes we have elected to proceed with panniculectomy that will include excision of her umbilicus and repair of her diastases abdominal wall with mesh. She generally has been feeling about the same. She had the flu over Christmas but has gotten over this completely.  Past Medical History  Diagnosis Date  . Eczema   . History of elevated glucose    Past Surgical History  Procedure Laterality Date  . Mouth surgery  09/2012    mass removed  . Cesarean section  [id number]  . Wisdom tooth extraction     Current Outpatient Prescriptions  Medication Sig Dispense Refill  . Cholecalciferol (VITAMIN D) 2000 UNITS tablet Take 2,000 Units by mouth daily.      . Omega-3 Fatty Acids (FISH OIL PO) Take 1 tablet by mouth daily.       No current facility-administered medications for this visit.   No Known Allergies History  Substance Use Topics  . Smoking status: Never Smoker   . Smokeless tobacco: Never Used  . Alcohol Use: Yes     Comment: OCCASIONAL   Exam: BP 114/60  Pulse 72  Temp(Src) 98.1 F (36.7 C) (Temporal)  Resp 14  Ht 5' 1"  (1.549 m)  Wt 126 lb 3.2 oz (57.244 kg)  BMI 23.86 kg/m2  LMP 05/08/2013 General: Appears well Skin: No rash or infection Lungs: Clear equal breath sounds without increased work of breathing Cardiac: Regular rate and rhythm. No edema  Abdomen: discrete umbilical hernia and very large diastases with marked diffuse abdominal wall laxity and protuberance. She also has a moderate pannus. He  Assessment and plan: We again discussed the procedure in detail. We plan panniculectomy with removal of her umbilicus and repair of her abdominal hernia and  diastases with mesh. We previously discussed the nature of the surgery and recovery and risks of bleeding, infection, recurrence. All her questions were answered today.

## 2013-05-22 NOTE — Progress Notes (Signed)
PT came for preop Fri 05/22/13 - no orders in EPIC - orders done per anesth protocol

## 2013-05-26 ENCOUNTER — Encounter (HOSPITAL_COMMUNITY): Payer: 59 | Admitting: Certified Registered Nurse Anesthetist

## 2013-05-26 ENCOUNTER — Inpatient Hospital Stay (HOSPITAL_COMMUNITY): Payer: 59 | Admitting: Certified Registered Nurse Anesthetist

## 2013-05-26 ENCOUNTER — Encounter (HOSPITAL_COMMUNITY)
Admission: RE | Disposition: A | Discharge status: Home / Self Care | Payer: Self-pay | Source: Ambulatory Visit | Attending: General Surgery

## 2013-05-26 ENCOUNTER — Encounter (HOSPITAL_COMMUNITY): Payer: Self-pay | Admitting: *Deleted

## 2013-05-26 ENCOUNTER — Inpatient Hospital Stay (HOSPITAL_COMMUNITY)
Admission: RE | Admit: 2013-05-26 | Discharge: 2013-05-29 | DRG: 355 | Disposition: A | Discharge status: Home / Self Care | Payer: 59 | Source: Ambulatory Visit | Attending: General Surgery | Admitting: General Surgery

## 2013-05-26 DIAGNOSIS — Z79899 Other long term (current) drug therapy: Secondary | ICD-10-CM

## 2013-05-26 DIAGNOSIS — L259 Unspecified contact dermatitis, unspecified cause: Secondary | ICD-10-CM | POA: Diagnosis present

## 2013-05-26 DIAGNOSIS — M6200 Separation of muscle (nontraumatic), unspecified site: Secondary | ICD-10-CM | POA: Diagnosis present

## 2013-05-26 DIAGNOSIS — L909 Atrophic disorder of skin, unspecified: Secondary | ICD-10-CM | POA: Diagnosis present

## 2013-05-26 DIAGNOSIS — K439 Ventral hernia without obstruction or gangrene: Principal | ICD-10-CM | POA: Diagnosis present

## 2013-05-26 DIAGNOSIS — E65 Localized adiposity: Secondary | ICD-10-CM | POA: Diagnosis present

## 2013-05-26 DIAGNOSIS — Z23 Encounter for immunization: Secondary | ICD-10-CM

## 2013-05-26 DIAGNOSIS — L919 Hypertrophic disorder of the skin, unspecified: Secondary | ICD-10-CM

## 2013-05-26 DIAGNOSIS — K429 Umbilical hernia without obstruction or gangrene: Secondary | ICD-10-CM | POA: Diagnosis present

## 2013-05-26 HISTORY — PX: PANNICULECTOMY: SHX5360

## 2013-05-26 HISTORY — PX: INSERTION OF MESH: SHX5868

## 2013-05-26 HISTORY — PX: INCISIONAL HERNIA REPAIR: SHX193

## 2013-05-26 SURGERY — REPAIR, HERNIA, INCISIONAL
Anesthesia: General | Site: Abdomen

## 2013-05-26 MED ORDER — ONDANSETRON HCL 4 MG PO TABS: 4.0000 mg | ORAL_TABLET | Freq: Four times a day (QID) | ORAL | Status: DC | PRN

## 2013-05-26 MED ORDER — HYDROMORPHONE 0.3 MG/ML IV SOLN
INTRAVENOUS | Status: DC
  Administered 2013-05-26: 1.2 mg via INTRAVENOUS
  Administered 2013-05-26: 15:00:00 via INTRAVENOUS
  Administered 2013-05-27: 1.2 mg via INTRAVENOUS
  Administered 2013-05-27: 0.6 mg via INTRAVENOUS
  Administered 2013-05-27: 1.5 mg via INTRAVENOUS
  Administered 2013-05-27 (×2): 0.9 mg via INTRAVENOUS
  Administered 2013-05-27: 1.5 mg via INTRAVENOUS
  Administered 2013-05-28: 0.6 mg via INTRAVENOUS
  Administered 2013-05-28: 2.1 mg via INTRAVENOUS
  Administered 2013-05-28: 1.2 mg via INTRAVENOUS
  Administered 2013-05-28: 2.1 mg via INTRAVENOUS
  Administered 2013-05-28: 1.8 mg via INTRAVENOUS
  Filled 2013-05-26: qty 25

## 2013-05-26 MED ORDER — INFLUENZA VAC SPLIT QUAD 0.5 ML IM SUSP
0.5000 mL | INTRAMUSCULAR | Status: AC
  Administered 2013-05-27: 0.5 mL via INTRAMUSCULAR
  Filled 2013-05-26 (×2): qty 0.5

## 2013-05-26 MED ORDER — HEPARIN SODIUM (PORCINE) 5000 UNIT/ML IJ SOLN
5000.0000 [IU] | Freq: Once | INTRAMUSCULAR | Status: AC
  Administered 2013-05-26: 5000 [IU] via SUBCUTANEOUS
  Filled 2013-05-26: qty 1

## 2013-05-26 MED ORDER — CHLORHEXIDINE GLUCONATE 4 % EX LIQD: 1.0000 "application " | Freq: Once | CUTANEOUS | Status: DC

## 2013-05-26 MED ORDER — LIDOCAINE HCL (CARDIAC) 20 MG/ML IV SOLN
INTRAVENOUS | Status: DC | PRN
  Administered 2013-05-26: 100 mg via INTRAVENOUS

## 2013-05-26 MED ORDER — ROCURONIUM BROMIDE 100 MG/10ML IV SOLN
INTRAVENOUS | Status: AC
  Filled 2013-05-26: qty 1

## 2013-05-26 MED ORDER — NEOSTIGMINE METHYLSULFATE 1 MG/ML IJ SOLN
INTRAMUSCULAR | Status: AC
  Filled 2013-05-26: qty 10

## 2013-05-26 MED ORDER — KCL IN DEXTROSE-NACL 20-5-0.9 MEQ/L-%-% IV SOLN
INTRAVENOUS | Status: AC
Start: 2013-05-26 — End: 2013-05-27
  Filled 2013-05-26: qty 1000

## 2013-05-26 MED ORDER — SODIUM CHLORIDE 0.9 % IJ SOLN: 9.0000 mL | INTRAMUSCULAR | Status: DC | PRN

## 2013-05-26 MED ORDER — KCL IN DEXTROSE-NACL 20-5-0.9 MEQ/L-%-% IV SOLN
INTRAVENOUS | Status: DC
  Administered 2013-05-26 – 2013-05-27 (×3): via INTRAVENOUS
  Administered 2013-05-27: 100 mL/h via INTRAVENOUS
  Administered 2013-05-28 – 2013-05-29 (×2): via INTRAVENOUS
  Filled 2013-05-26 (×9): qty 1000

## 2013-05-26 MED ORDER — PROPOFOL 10 MG/ML IV BOLUS
INTRAVENOUS | Status: AC
  Filled 2013-05-26: qty 20

## 2013-05-26 MED ORDER — SUCCINYLCHOLINE CHLORIDE 20 MG/ML IJ SOLN
INTRAMUSCULAR | Status: DC | PRN
  Administered 2013-05-26: 100 mg via INTRAVENOUS

## 2013-05-26 MED ORDER — ONDANSETRON HCL 4 MG/2ML IJ SOLN: 4.0000 mg | Freq: Four times a day (QID) | INTRAMUSCULAR | Status: DC | PRN

## 2013-05-26 MED ORDER — GLYCOPYRROLATE 0.2 MG/ML IJ SOLN
INTRAMUSCULAR | Status: AC
  Filled 2013-05-26: qty 3

## 2013-05-26 MED ORDER — FENTANYL CITRATE 0.05 MG/ML IJ SOLN
INTRAMUSCULAR | Status: DC | PRN
  Administered 2013-05-26 (×5): 50 ug via INTRAVENOUS

## 2013-05-26 MED ORDER — HYDROMORPHONE HCL PF 1 MG/ML IJ SOLN
INTRAMUSCULAR | Status: AC
  Filled 2013-05-26: qty 1

## 2013-05-26 MED ORDER — ONDANSETRON HCL 4 MG/2ML IJ SOLN
INTRAMUSCULAR | Status: AC
  Filled 2013-05-26: qty 2

## 2013-05-26 MED ORDER — LIDOCAINE HCL (CARDIAC) 20 MG/ML IV SOLN
INTRAVENOUS | Status: AC
  Filled 2013-05-26: qty 5

## 2013-05-26 MED ORDER — ONDANSETRON HCL 4 MG/2ML IJ SOLN
4.0000 mg | Freq: Four times a day (QID) | INTRAMUSCULAR | Status: DC | PRN
  Administered 2013-05-26 – 2013-05-27 (×2): 4 mg via INTRAVENOUS
  Filled 2013-05-26 (×2): qty 2

## 2013-05-26 MED ORDER — ROCURONIUM BROMIDE 100 MG/10ML IV SOLN
INTRAVENOUS | Status: DC | PRN
  Administered 2013-05-26: 10 mg via INTRAVENOUS
  Administered 2013-05-26: 40 mg via INTRAVENOUS
  Administered 2013-05-26 (×2): 10 mg via INTRAVENOUS

## 2013-05-26 MED ORDER — FENTANYL CITRATE 0.05 MG/ML IJ SOLN
INTRAMUSCULAR | Status: AC
  Filled 2013-05-26: qty 5

## 2013-05-26 MED ORDER — NEOSTIGMINE METHYLSULFATE 1 MG/ML IJ SOLN
INTRAMUSCULAR | Status: DC | PRN
  Administered 2013-05-26: 5 mg via INTRAVENOUS

## 2013-05-26 MED ORDER — DIPHENHYDRAMINE HCL 50 MG/ML IJ SOLN: 12.5000 mg | Freq: Four times a day (QID) | INTRAMUSCULAR | Status: DC | PRN

## 2013-05-26 MED ORDER — OXYCODONE-ACETAMINOPHEN 5-325 MG PO TABS
1.0000 | ORAL_TABLET | ORAL | Status: DC | PRN
  Administered 2013-05-28: 1 via ORAL
  Administered 2013-05-28: 2 via ORAL
  Administered 2013-05-29: 1 via ORAL
  Administered 2013-05-29: 2 via ORAL
  Filled 2013-05-26: qty 1
  Filled 2013-05-26 (×2): qty 2
  Filled 2013-05-26: qty 1

## 2013-05-26 MED ORDER — CEFAZOLIN SODIUM-DEXTROSE 2-3 GM-% IV SOLR
INTRAVENOUS | Status: AC
  Filled 2013-05-26: qty 50

## 2013-05-26 MED ORDER — ONDANSETRON HCL 4 MG/2ML IJ SOLN
INTRAMUSCULAR | Status: DC | PRN
  Administered 2013-05-26: 4 mg via INTRAVENOUS

## 2013-05-26 MED ORDER — CEFAZOLIN SODIUM-DEXTROSE 2-3 GM-% IV SOLR
2.0000 g | INTRAVENOUS | Status: AC
  Administered 2013-05-26: 2 g via INTRAVENOUS

## 2013-05-26 MED ORDER — GLYCOPYRROLATE 0.2 MG/ML IJ SOLN
INTRAMUSCULAR | Status: DC | PRN
  Administered 2013-05-26: 0.6 mg via INTRAVENOUS

## 2013-05-26 MED ORDER — DIPHENHYDRAMINE HCL 12.5 MG/5ML PO ELIX: 12.5000 mg | ORAL_SOLUTION | Freq: Four times a day (QID) | ORAL | Status: DC | PRN

## 2013-05-26 MED ORDER — LACTATED RINGERS IV SOLN
INTRAVENOUS | Status: DC
  Administered 2013-05-26: 13:00:00 via INTRAVENOUS
  Administered 2013-05-26: 10000 mL via INTRAVENOUS

## 2013-05-26 MED ORDER — MIDAZOLAM HCL 2 MG/2ML IJ SOLN
INTRAMUSCULAR | Status: AC
  Filled 2013-05-26: qty 2

## 2013-05-26 MED ORDER — HYDROMORPHONE 0.3 MG/ML IV SOLN
INTRAVENOUS | Status: AC
  Filled 2013-05-26: qty 25

## 2013-05-26 MED ORDER — PROPOFOL 10 MG/ML IV BOLUS
INTRAVENOUS | Status: DC | PRN
  Administered 2013-05-26: 120 mg via INTRAVENOUS

## 2013-05-26 MED ORDER — DEXAMETHASONE SODIUM PHOSPHATE 10 MG/ML IJ SOLN
INTRAMUSCULAR | Status: DC | PRN
  Administered 2013-05-26: 10 mg via INTRAVENOUS

## 2013-05-26 MED ORDER — DEXAMETHASONE SODIUM PHOSPHATE 10 MG/ML IJ SOLN
INTRAMUSCULAR | Status: AC
  Filled 2013-05-26: qty 1

## 2013-05-26 MED ORDER — MIDAZOLAM HCL 5 MG/5ML IJ SOLN
INTRAMUSCULAR | Status: DC | PRN
  Administered 2013-05-26: 2 mg via INTRAVENOUS

## 2013-05-26 MED ORDER — NALOXONE HCL 0.4 MG/ML IJ SOLN: 0.4000 mg | INTRAMUSCULAR | Status: DC | PRN

## 2013-05-26 MED ORDER — HEPARIN SODIUM (PORCINE) 5000 UNIT/ML IJ SOLN
5000.0000 [IU] | Freq: Three times a day (TID) | INTRAMUSCULAR | Status: DC
  Administered 2013-05-26 – 2013-05-29 (×8): 5000 [IU] via SUBCUTANEOUS
  Filled 2013-05-26 (×11): qty 1

## 2013-05-26 MED ORDER — HYDROMORPHONE HCL PF 1 MG/ML IJ SOLN
0.2500 mg | INTRAMUSCULAR | Status: DC | PRN
  Administered 2013-05-26 (×4): 0.5 mg via INTRAVENOUS

## 2013-05-26 MED ORDER — LACTATED RINGERS IV SOLN: INTRAVENOUS | Status: DC

## 2013-05-26 SURGICAL SUPPLY — 32 items
CHLORAPREP W/TINT 26ML (MISCELLANEOUS) ×2 IMPLANT
DEVICE TROCAR PUNCTURE CLOSURE (ENDOMECHANICALS) ×2 IMPLANT
DRAIN CHANNEL 19F RND (DRAIN) ×2 IMPLANT
DRAPE LAPAROSCOPIC ABDOMINAL (DRAPES) ×2 IMPLANT
ELECT REM PT RETURN 9FT ADLT (ELECTROSURGICAL) ×2
ELECTRODE REM PT RTRN 9FT ADLT (ELECTROSURGICAL) ×1 IMPLANT
EVACUATOR DRAINAGE 7X20 100CC (MISCELLANEOUS) ×1 IMPLANT
EVACUATOR SILICONE 100CC (MISCELLANEOUS) ×1
GLOVE BIOGEL PI IND STRL 7.0 (GLOVE) ×2 IMPLANT
GLOVE BIOGEL PI INDICATOR 7.0 (GLOVE) ×2
GLOVE SS BIOGEL STRL SZ 7.5 (GLOVE) ×4 IMPLANT
GLOVE SUPERSENSE BIOGEL SZ 7.5 (GLOVE) ×4
GOWN BRE IMP SLV AUR XL STRL (GOWN DISPOSABLE) IMPLANT
GOWN STRL REUS W/TWL LRG LVL3 (GOWN DISPOSABLE) ×2 IMPLANT
GOWN STRL REUS W/TWL XL LVL3 (GOWN DISPOSABLE) ×8 IMPLANT
KIT BASIN OR (CUSTOM PROCEDURE TRAY) ×2 IMPLANT
MESH SOFT 12X12IN BARD (Mesh General) ×2 IMPLANT
PACK GENERAL/GYN (CUSTOM PROCEDURE TRAY) ×2 IMPLANT
SPONGE GAUZE 4X4 12PLY (GAUZE/BANDAGES/DRESSINGS) ×6 IMPLANT
STAPLER VISISTAT (STAPLE) ×2 IMPLANT
STAPLER VISISTAT 35W (STAPLE) ×2 IMPLANT
SUT ETHILON 3 0 PS 1 (SUTURE) ×2 IMPLANT
SUT NOVA NAB GS-21 1 T12 (SUTURE) ×2 IMPLANT
SUT PDS AB 0 CT1 36 (SUTURE) ×4 IMPLANT
SUT PROLENE 0 CT 1 CR/8 (SUTURE) ×4 IMPLANT
SUT VIC AB 2-0 CT2 27 (SUTURE) ×4 IMPLANT
SUT VIC AB 3-0 54XBRD REEL (SUTURE) ×1 IMPLANT
SUT VIC AB 3-0 BRD 54 (SUTURE) ×1
SYRINGE 10CC LL (SYRINGE) ×2 IMPLANT
TIPS TEFLON (MISCELLANEOUS) ×2 IMPLANT
TOWEL OR 17X26 10 PK STRL BLUE (TOWEL DISPOSABLE) ×4 IMPLANT
TRAY FOLEY CATH 14FRSI W/METER (CATHETERS) IMPLANT

## 2013-05-26 NOTE — H&P (View-Only) (Signed)
Chief complaint: Abdominal hernia  History: Patient returns to the office for a preop visit prior to planned ventral double hernia repair. Please see my previous note for details. She has a discrete umbilical hernia but also a very large diastases in general abdominal wall laxity. For multiple reasons detailed in previous notes we have elected to proceed with panniculectomy that will include excision of her umbilicus and repair of her diastases abdominal wall with mesh. She generally has been feeling about the same. She had the flu over Christmas but has gotten over this completely.  Past Medical History  Diagnosis Date  . Eczema   . History of elevated glucose    Past Surgical History  Procedure Laterality Date  . Mouth surgery  09/2012    mass removed  . Cesarean section  [id number]  . Wisdom tooth extraction     Current Outpatient Prescriptions  Medication Sig Dispense Refill  . Cholecalciferol (VITAMIN D) 2000 UNITS tablet Take 2,000 Units by mouth daily.      . Omega-3 Fatty Acids (FISH OIL PO) Take 1 tablet by mouth daily.       No current facility-administered medications for this visit.   No Known Allergies History  Substance Use Topics  . Smoking status: Never Smoker   . Smokeless tobacco: Never Used  . Alcohol Use: Yes     Comment: OCCASIONAL   Exam: BP 114/60  Pulse 72  Temp(Src) 98.1 F (36.7 C) (Temporal)  Resp 14  Ht 5' 1"  (1.549 m)  Wt 126 lb 3.2 oz (57.244 kg)  BMI 23.86 kg/m2  LMP 05/08/2013 General: Appears well Skin: No rash or infection Lungs: Clear equal breath sounds without increased work of breathing Cardiac: Regular rate and rhythm. No edema  Abdomen: discrete umbilical hernia and very large diastases with marked diffuse abdominal wall laxity and protuberance. She also has a moderate pannus. He  Assessment and plan: We again discussed the procedure in detail. We plan panniculectomy with removal of her umbilicus and repair of her abdominal hernia and  diastases with mesh. We previously discussed the nature of the surgery and recovery and risks of bleeding, infection, recurrence. All her questions were answered today.

## 2013-05-26 NOTE — Interval H&P Note (Signed)
History and Physical Interval Note:  05/26/2013 10:22 AM  Brooke [name]  has presented today for surgery, with the diagnosis of abdominal hernia   The various methods of treatment have been discussed with the patient and family. After consideration of risks, benefits and other options for treatment, the patient has consented to  Procedure(s): open REPAIR abdominal hernia with mesh INCISIONAL (N/A) INSERTION OF MESH (N/A) as a surgical intervention .  The patient's history has been reviewed, patient examined, no change in status, stable for surgery.  I have reviewed the patient's chart and labs.  Questions were answered to the patient's satisfaction.     Betania Dizon T

## 2013-05-26 NOTE — Anesthesia Preprocedure Evaluation (Addendum)
Anesthesia Evaluation  Patient identified by MRN, date of birth, ID band Patient awake    Reviewed: Allergy & Precautions, H&P , NPO status , Patient's Chart, lab work & pertinent test results  Airway Mallampati: II TM Distance: >3 FB Neck ROM: full    Dental  (+) Edentulous Upper and Edentulous Lower   Pulmonary neg pulmonary ROS,  breath sounds clear to auscultation  Pulmonary exam normal       Cardiovascular Exercise Tolerance: Good negative cardio ROS  Rhythm:regular Rate:Normal     Neuro/Psych negative neurological ROS  negative psych ROS   GI/Hepatic negative GI ROS, Neg liver ROS,   Endo/Other  negative endocrine ROS  Renal/GU negative Renal ROS  negative genitourinary   Musculoskeletal   Abdominal   Peds  Hematology negative hematology ROS (+)   Anesthesia Other Findings   Reproductive/Obstetrics negative OB ROS                          Anesthesia Physical Anesthesia Plan  ASA: II  Anesthesia Plan: General   Post-op Pain Management:    Induction:   Airway Management Planned:   Additional Equipment:   Intra-op Plan:   Post-operative Plan:   Informed Consent: I have reviewed the patients History and Physical, chart, labs and discussed the procedure including the risks, benefits and alternatives for the proposed anesthesia with the patient or authorized representative who has indicated his/her understanding and acceptance.   Dental Advisory Given  Plan Discussed with: CRNA  Anesthesia Plan Comments:         Anesthesia Quick Evaluation

## 2013-05-26 NOTE — Progress Notes (Signed)
In and out cath done- 500 cc yellow-colored urine obtained

## 2013-05-26 NOTE — Op Note (Signed)
Preoperative Diagnosis: abdominal hernia   Postoprative Diagnosis: abdominal hernia   Procedure: Procedure(s):  Open repair of ventral abdominal hernia component release and retro-rectus placement of mesh  Surgeon: Excell Seltzer T   Assistants: Johnathan Hausen  Anesthesia:  General endotracheal anesthesia  Indications: patient is a 44 year old female with a history of 4 pregnancies presents with a gradually enlarging and increasingly symptomatic ventral hernia. Her only previous surgery has been C-sections. She however has a good-sized umbilical hernia that is somewhat discrete but also a very large diastases in her midabdomen with marked protuberance and thinning of her abdominal wall with an almost "prune-belly" abdominal wall. After a number of consultations in discussion of various options detailed elsewhere we have elected to proceed with abdominal wall reconstruction with panniculectomy and retrorectus placement of mesh including repair of her diastases. She is now brought to the operating room for this procedure.    Procedure Detail:  Patient was brought to the operating room, placed in the supine position on the operating table, and general endotracheal anesthesia induced. She received preoperative IV antibiotics. Foley catheter was placed. The abdomen was widely sterilely prepped and draped. The patient timeout was performed and correct procedure verified. The patient had marked stretching of the skin of her mid abdomen around the umbilicus with a significant pannus in this area and a transverse incision was used with the elliptical excision of the large area of redundant skin and subcutaneous tissue this tissue was dissected up off of the anterior fascia which was extremely thin and stretched with a discrete hernia at the umbilicus as well. The specimen was removed and sent for permanent pathology. Following this skin and subcutaneous flaps were raised superiorly up toward the xiphoid  and inferiorly down to the previous Pfannenstiel incision. The abdomen was then opened along the midline. There was significant diastases in quite a bit of retraction of the rectus muscles. The attenuated fascia medial to the  rectus  Muscles was excised with cautery back to the edge of the rectus and this was carried up superiorly until we encountered essentially normal fascia inferiorly as well. Following this the retrorectus space was developed widely laterally back to the flanks and inferiorly down to the groins and superiorly up to the xiphoid. The peritoneum and posterior fascia was then closed in the midline with running 0 PDS suture. A large piece of Bard lightweight polypropylene mesh was fashioned to  Place in the retrorectus space and overall measured about 20 x 15 cm this was positioned and then sutured starting initially around the xiphoid with transabdominal fixation sutures brought out through small stab wounds through the skin and then as we came down laterally as we had a transverse panniculectomy incision the mesh was sutured back to the lateral abdominal wall with trans-abdominal sutures within the incision and then finally inferiorly down toward the inguinal ligaments again with sutures through the muscular abdominal wall. This widely deployed the mesh again out to the flanks and up to the xiphoid and down toward the pubis. The wound was irrigated and complete hemostasis assured. The midline was then closed reapproximating the rectus muscles in the midline with running #1 Novafil suture begun at either end of the incision and tied centrally. Subcutaneous tissue was again thoroughly irrigated. A 19 Blake drain was left through a separate stab wound underneath the skin and subcutaneous flaps. The subcutaneous was closed with running 2-0 Vicryl and the skin closed with staples. Sponge needle instrument counts were correct.  Findings: As above  Estimated Blood Loss:  200 mL         Drains:  19 round Blake in subcutaneous space  Blood Given: none          Specimens: pannus, skin and subcutaneous tissue        Complications:  * No complications entered in OR log *         Disposition: PACU - hemodynamically stable.         Condition: stable

## 2013-05-26 NOTE — Anesthesia Postprocedure Evaluation (Signed)
  Anesthesia Post-op Note  Patient: Brooke [name]  Procedure(s) Performed: Procedure(s) (LRB): open REPAIR abdominal hernia with mesh INCISIONAL (N/A) INSERTION OF MESH (N/A) PANNICULECTOMY (N/A)  Patient Location: PACU  Anesthesia Type: General  Level of Consciousness: awake and alert   Airway and Oxygen Therapy: Patient Spontanous Breathing  Post-op Pain: mild  Post-op Assessment: Post-op Vital signs reviewed, Patient's Cardiovascular Status Stable, Respiratory Function Stable, Patent Airway and No signs of Nausea or vomiting  Last Vitals:  Filed Vitals:   05/26/13 1500  BP: 110/59  Pulse: 58  Temp: 36.8 C  Resp: 12    Post-op Vital Signs: stable   Complications: No apparent anesthesia complications

## 2013-05-26 NOTE — Transfer of Care (Signed)
Immediate Anesthesia Transfer of Care Note  Patient: Brooke [name]  Procedure(s) Performed: Procedure(s): open REPAIR abdominal hernia with mesh INCISIONAL (N/A) INSERTION OF MESH (N/A) PANNICULECTOMY (N/A)  Patient Location: PACU  Anesthesia Type:General  Level of Consciousness: sedated  Airway & Oxygen Therapy: Patient Spontanous Breathing and Patient connected to face mask oxygen  Post-op Assessment: Report given to PACU RN and Post -op Vital signs reviewed and stable  Post vital signs: Reviewed and stable  Complications: No apparent anesthesia complications

## 2013-05-27 ENCOUNTER — Encounter (HOSPITAL_COMMUNITY): Payer: Self-pay | Admitting: General Surgery

## 2013-05-27 LAB — CBC
HEMATOCRIT: 31.5 % — AB (ref 36.0–46.0)
Hemoglobin: 11 g/dL — ABNORMAL LOW (ref 12.0–15.0)
MCH: 31.3 pg (ref 26.0–34.0)
MCHC: 34.9 g/dL (ref 30.0–36.0)
MCV: 89.7 fL (ref 78.0–100.0)
Platelets: 181 10*3/uL (ref 150–400)
RBC: 3.51 MIL/uL — ABNORMAL LOW (ref 3.87–5.11)
RDW: 12.1 % (ref 11.5–15.5)
WBC: 12.3 10*3/uL — ABNORMAL HIGH (ref 4.0–10.5)

## 2013-05-27 LAB — BASIC METABOLIC PANEL
BUN: 10 mg/dL (ref 6–23)
CO2: 22 mEq/L (ref 19–32)
Calcium: 8 mg/dL — ABNORMAL LOW (ref 8.4–10.5)
Chloride: 101 mEq/L (ref 96–112)
Creatinine, Ser: 0.6 mg/dL (ref 0.50–1.10)
Glucose, Bld: 197 mg/dL — ABNORMAL HIGH (ref 70–99)
POTASSIUM: 4.3 meq/L (ref 3.7–5.3)
Sodium: 134 mEq/L — ABNORMAL LOW (ref 137–147)

## 2013-05-27 NOTE — Progress Notes (Signed)
Patient ID: Brooke [name], female   DOB: 24-Jul-1970, 43 y.o.   MRN: [id number] 1 Day Post-Op  Subjective: Nauseated, just medicated.  Good pain control with PCA.  Has been OOB and voided  Objective: Vital signs in last 24 hours: Temp:  [98 F (36.7 C)-98.9 F (37.2 C)] 98.7 F (37.1 C) (01/07 0541) Pulse Rate:  [58-83] 80 (01/07 0541) Resp:  [10-18] 13 (01/07 0823) BP: (97-137)/(56-85) 111/76 mmHg (01/07 0541) SpO2:  [97 %-100 %] 100 % (01/07 0823) Weight:  [126 lb 3.2 oz (57.244 kg)] 126 lb 3.2 oz (57.244 kg) (01/06 1615) Last BM Date: 05/26/13  Intake/Output from previous day: 01/06 0701 - 01/07 0700 In: 4170 [P.O.:720; I.V.:3450] Out: 1725 [Urine:1600; Drains:125] Intake/Output this shift:    General appearance: alert, cooperative and no distress Resp: No icreased WOB GI: Mild appropriate tenderness Incision/Wound: Dressing clean and dry  Lab Results:   Recent Labs  05/27/13 0533  WBC 12.3*  HGB 11.0*  HCT 31.5*  PLT 181   BMET  Recent Labs  05/27/13 0533  NA 134*  K 4.3  CL 101  CO2 22  GLUCOSE 197*  BUN 10  CREATININE 0.60  CALCIUM 8.0*     Studies/Results: No results found.  Anti-infectives: Anti-infectives   Start     Dose/Rate Route Frequency Ordered Stop   05/26/13 0853  ceFAZolin (ANCEF) IVPB 2 g/50 mL premix     2 g 100 mL/hr over 30 Minutes Intravenous On call to O.R. 05/26/13 0853 05/26/13 1110      Assessment/Plan: s/p Procedure(s): open REPAIR abdominal hernia with mesh INCISIONAL INSERTION OF MESH PANNICULECTOMY Stable/doing well post op Mobilize as tolerated, await resolution of nausea before advancing diet   LOS: 1 day    Kelby Lotspeich T 05/27/2013

## 2013-05-27 NOTE — Care Management Note (Signed)
    Page 1 of 1   05/27/2013     10:59:20 AM   CARE MANAGEMENT NOTE 05/27/2013  Patient:  Brooke [name], Brooke [name]   Account Number:  [id number]  Date Initiated:  05/27/2013  Documentation initiated by:  Sunday Spillers  Subjective/Objective Assessment:   43 yo female admitted s/p Open repair of ventral abdominal hernia component release and retro-rectus placement of mesh.  PTA lived at home with spouse.     Action/Plan:   Home when stable   Anticipated DC Date:  05/27/2013   Anticipated DC Plan:  [place]  CM consult      Choice offered to / List presented to:             Status of service:  Completed, signed off Medicare Important Message given?   (If response is "NO", the following Medicare IM given date fields will be blank) Date Medicare IM given:   Date Additional Medicare IM given:    Discharge Disposition:  HOME/SELF CARE  Per UR Regulation:  Reviewed for med. necessity/level of care/duration of stay  If discussed at [place] of Stay Meetings, dates discussed:    Comments:

## 2013-05-28 LAB — CBC
HEMATOCRIT: 30.7 % — AB (ref 36.0–46.0)
HEMOGLOBIN: 10.1 g/dL — AB (ref 12.0–15.0)
MCH: 30.2 pg (ref 26.0–34.0)
MCHC: 32.9 g/dL (ref 30.0–36.0)
MCV: 91.9 fL (ref 78.0–100.0)
Platelets: 138 10*3/uL — ABNORMAL LOW (ref 150–400)
RBC: 3.34 MIL/uL — ABNORMAL LOW (ref 3.87–5.11)
RDW: 12.5 % (ref 11.5–15.5)
WBC: 5.5 10*3/uL (ref 4.0–10.5)

## 2013-05-28 LAB — BASIC METABOLIC PANEL
BUN: 4 mg/dL — AB (ref 6–23)
CHLORIDE: 104 meq/L (ref 96–112)
CO2: 23 mEq/L (ref 19–32)
Calcium: 8.1 mg/dL — ABNORMAL LOW (ref 8.4–10.5)
Creatinine, Ser: 0.52 mg/dL (ref 0.50–1.10)
GFR calc Af Amer: >90 mL/min (ref >90)
Glucose, Bld: 165 mg/dL — ABNORMAL HIGH (ref 70–99)
POTASSIUM: 4.2 meq/L (ref 3.7–5.3)
SODIUM: 138 meq/L (ref 137–147)

## 2013-05-28 MED ORDER — OXYCODONE-ACETAMINOPHEN 5-325 MG PO TABS
1.0000 | ORAL_TABLET | ORAL | Status: DC | PRN
Start: 2013-05-28 — End: 2013-05-28

## 2013-05-28 NOTE — Progress Notes (Signed)
Patient ID: Brooke [name], female   DOB: December 10, 1970, 43 y.o.   MRN: [id number] 2 Days Post-Op  Subjective: Feels much better today. Less pain. Denies nausea and starting to drink liquids. Not using the PCA much.  Objective: Vital signs in last 24 hours: Temp:  [98.1 F (36.7 C)-99.2 F (37.3 C)] 98.8 F (37.1 C) (01/08 0531) Pulse Rate:  [61-75] 61 (01/08 0531) Resp:  [12-18] 16 (01/08 0531) BP: (102-115)/(61-74) 111/74 mmHg (01/08 0531) SpO2:  [97 %-100 %] 97 % (01/08 0531) FiO2 (%):  [36 %-41 %] 41 % (01/08 0439) Last BM Date: 05/26/13 (PTA)  Intake/Output from previous day: 01/07 0701 - 01/08 0700 In: 3386.7 [P.O.:960; I.V.:2426.7] Out: 2865 [Urine:2800; Drains:65] Intake/Output this shift:    General appearance: alert, cooperative and no distress GI: nontender Incision/Wound: dressing intact and clean and dry  Lab Results:   Recent Labs  05/27/13 0533 05/28/13 0550  WBC 12.3* 5.5  HGB 11.0* 10.1*  HCT 31.5* 30.7*  PLT 181 138*   BMET  Recent Labs  05/27/13 0533 05/28/13 0550  NA 134* 138  K 4.3 4.2  CL 101 104  CO2 22 23  GLUCOSE 197* 165*  BUN 10 4*  CREATININE 0.60 0.52  CALCIUM 8.0* 8.1*     Studies/Results: No results found.  Anti-infectives: Anti-infectives   Start     Dose/Rate Route Frequency Ordered Stop   05/26/13 0853  ceFAZolin (ANCEF) IVPB 2 g/50 mL premix     2 g 100 mL/hr over 30 Minutes Intravenous On call to O.R. 05/26/13 0853 05/26/13 1110      Assessment/Plan: s/p Procedure(s): open REPAIR abdominal hernia with mesh INCISIONAL INSERTION OF MESH PANNICULECTOMY Doing well. Continue liquids this morning and advance diet as tolerated. Try oral pain medications.   LOS: 2 days    [name] T 05/28/2013

## 2013-05-29 MED ORDER — OXYCODONE-ACETAMINOPHEN 5-325 MG PO TABS: 1.0000 | ORAL_TABLET | ORAL | Status: DC | PRN

## 2013-05-29 NOTE — Progress Notes (Signed)
Patient ID: Brooke [name], female   DOB: [birth year], 43 y.o.   MRN: [id number] 3 Days Post-Op  Subjective: Feels better today. Pain well-controlled with oral medications. Tolerating a liquid and soft diet without difficulty.  Objective: Vital signs in last 24 hours: Temp:  [98.2 F (36.8 C)-99.2 F (37.3 C)] 98.2 F (36.8 C) (01/09 0600) Pulse Rate:  [68-86] 77 (01/09 0600) Resp:  [15-18] 18 (01/09 0600) BP: (90-121)/(58-77) 105/70 mmHg (01/09 0600) SpO2:  [10 %-100 %] 97 % (01/09 0600) Last BM Date: 05/26/13  Intake/Output from previous day: 01/08 0701 - 01/09 0700 In: 2968.8 [P.O.:1260; I.V.:1708.8] Out: 3267 [Urine:3900; Drains:75] Intake/Output this shift:    General appearance: alert, cooperative and no distress GI: normal findings: soft, non-tender Incision/Wound: clean and dry without evidence of infection. JP draining serosanguineous.  Lab Results:   Recent Labs  05/27/13 0533 05/28/13 0550  WBC 12.3* 5.5  HGB 11.0* 10.1*  HCT 31.5* 30.7*  PLT 181 138*   BMET  Recent Labs  05/27/13 0533 05/28/13 0550  NA 134* 138  K 4.3 4.2  CL 101 104  CO2 22 23  GLUCOSE 197* 165*  BUN 10 4*  CREATININE 0.60 0.52  CALCIUM 8.0* 8.1*     Studies/Results: No results found.  Anti-infectives: Anti-infectives   Start     Dose/Rate Route Frequency Ordered Stop   05/26/13 0853  ceFAZolin (ANCEF) IVPB 2 g/50 mL premix     2 g 100 mL/hr over 30 Minutes Intravenous On call to O.R. 05/26/13 0853 05/26/13 1110      Assessment/Plan: s/p Procedure(s): open REPAIR abdominal hernia with mesh INCISIONAL INSERTION OF MESH PANNICULECTOMY Doing well postoperatively. Ready for discharge.   LOS: 3 days    Nazly Digilio T 05/29/2013

## 2013-05-29 NOTE — Discharge Instructions (Signed)
[facility]      [facility] [phone]  OPEN ABDOMINAL SURGERY: POST OP INSTRUCTIONS  Always review your discharge instruction sheet given to you by the facility where your surgery was performed.  IF YOU HAVE DISABILITY OR FAMILY LEAVE FORMS, YOU MUST BRING THEM TO THE OFFICE FOR PROCESSING.  PLEASE DO NOT GIVE THEM TO YOUR DOCTOR.  1. A prescription for pain medication may be given to you upon discharge.  Take your pain medication as prescribed, if needed.  If narcotic pain medicine is not needed, then you may take acetaminophen (Tylenol) or ibuprofen (Advil) as needed. 2. Take your usually prescribed medications unless otherwise directed. 3. If you need a refill on your pain medication, please contact your pharmacy. They will contact our office to request authorization.  Prescriptions will not be filled after 5pm or on week-ends. 4. You should follow a light diet the first few days after arrival home, such as soup and crackers, pudding, etc.unless your doctor has advised otherwise. A high-fiber, low fat diet can be resumed as tolerated.   Be sure to include lots of fluids daily. Most patients will experience some swelling and bruising on the chest and neck area.  Ice packs will help.  Swelling and bruising can take several days to resolve 5. Most patients will experience some swelling and bruising in the area of the incision. Ice pack will help. Swelling and bruising can take several days to resolve..  6. It is common to experience some constipation if taking pain medication after surgery.  Increasing fluid intake and taking a stool softener will usually help or prevent this problem from occurring.  A mild laxative (Milk of Magnesia or Miralax) should be taken according to package directions if there are no bowel movements after 48 hours. 7.  You may have steri-strips (small skin tapes) in place directly over the incision.  These strips should be left on the skin for 7-10 days.  If your  surgeon used skin glue on the incision, you may shower in 24 hours.  The glue will flake off over the next 2-3 weeks.  Any sutures or staples will be removed at the office during your follow-up visit. You may find that a light gauze bandage over your incision may keep your staples from being rubbed or pulled. You may shower and replace the bandage daily. 8. ACTIVITIES:  You may resume regular (light) daily activities beginning the next day--such as daily self-care, walking, climbing stairs--gradually increasing activities as tolerated.  You may have sexual intercourse when it is comfortable.  Refrain from any heavy lifting or straining until approved by your doctor. a. You may drive when you no longer are taking prescription pain medication, you can comfortably wear a seatbelt, and you can safely maneuver your car and apply brakes b. Return to Work: ___________________________________ 9. You should see your doctor in the office for a follow-up appointment approximately two weeks after your surgery.  Make sure that you call for this appointment within a day or two after you arrive home to insure a convenient appointment time. OTHER INSTRUCTIONS:  _____________________________________________________________ _____________________________________________________________  WHEN TO CALL YOUR DOCTOR: 1. Fever over 101.0 2. Inability to urinate 3. Nausea and/or vomiting 4. Extreme swelling or bruising 5. Continued bleeding from incision. 6. Increased pain, redness, or drainage from the incision. 7. Difficulty swallowing or breathing 8. Muscle cramping or spasms. 9. Numbness or tingling in hands or feet or around lips.  The clinic staff is available to   answer your questions during regular business hours.  Please don't hesitate to call and ask to speak to one of the nurses if you have concerns.  For further questions, please visit [email or web address]   

## 2013-05-29 NOTE — Progress Notes (Signed)
Patient instructed on how to care for JP drain at home. She returned demonstrated dressing change, emptying, documenting output and re-charging the drain. No concerns voiced at this time. Discharged home with family.

## 2013-05-29 NOTE — Discharge Summary (Signed)
   Patient ID: Brooke [name] [id number] 42 y.o. [birth year]  05/26/2013  Discharge date and time: 05/29/2013   Admitting Physician: Excell Seltzer T  Discharge Physician: Excell Seltzer T  Admission Diagnoses: abdominal hernia   Discharge Diagnoses: same  Operations: Procedure(s): open REPAIR abdominal hernia with mesh INCISIONAL INSERTION OF MESH PANNICULECTOMY  Admission Condition: good  Discharged Condition: good  Indication for Admission: patient is a 43 year old female who presents with a enlarging umbilical hernia as well as a very large prominent diastases with marked thinning and protruded so the abdominal wall. After extensive preoperative workup and discussion detailed elsewhere she is electively admitted for extensive ventral hernia repair with mesh with panniculectomy  Hospital Course: on the morning of admission the patient underwent an open ventral hernia repair with mesh and colectomy. She tolerated the procedure well. Her postoperative course was very smooth. She had good pain control initially on PCA the law that and was able to be switched over to oral medications after about 48 hours. There were no wound complications. Lap and vital signs were unremarkable. On the day of discharge she is comfortable. Tolerating a diet. Abdomen is soft and nontender. Wound healing well. JP drain remains in place with serosanguineous drainage.  Disposition: Home  Patient Instructions:    Medication List         FISH OIL PO  Take 1 tablet by mouth daily.     oxyCODONE-acetaminophen 5-325 MG per tablet  Commonly known as:  PERCOCET/ROXICET  Take 1-2 tablets by mouth every 4 (four) hours as needed for moderate pain.     Vitamin D 2000 UNITS tablet  Take 2,000 Units by mouth daily.        Activity: no heavy lifting for 6 weeks Diet: regular diet Wound Care: empty JP drain daily and record amount. Leave incision dry.  Follow-up:  With Dr. Excell Seltzer in 3  weeks.  Signed: Edward Jolly MD, FACS  05/29/2013, 8:38 AM

## 2013-06-05 ENCOUNTER — Ambulatory Visit (INDEPENDENT_AMBULATORY_CARE_PROVIDER_SITE_OTHER): Payer: 59 | Admitting: General Surgery

## 2013-06-05 ENCOUNTER — Encounter (INDEPENDENT_AMBULATORY_CARE_PROVIDER_SITE_OTHER): Payer: Self-pay | Admitting: General Surgery

## 2013-06-05 VITALS — BP 116/78 | HR 60 | Temp 97.4°F | Resp 16 | Ht 61.0 in | Wt 122.6 lb

## 2013-06-05 DIAGNOSIS — K439 Ventral hernia without obstruction or gangrene: Secondary | ICD-10-CM

## 2013-06-05 DIAGNOSIS — M6200 Separation of muscle (nontraumatic), unspecified site: Secondary | ICD-10-CM

## 2013-06-05 DIAGNOSIS — M6208 Separation of muscle (nontraumatic), other site: Secondary | ICD-10-CM

## 2013-06-05 MED ORDER — OXYCODONE-ACETAMINOPHEN 5-325 MG PO TABS: 1.0000 | ORAL_TABLET | ORAL | Status: DC | PRN

## 2013-06-05 NOTE — Patient Instructions (Signed)
Keep the drain site covered with Band-Aids for several days until dry. No heavy lifting or bending or stooping.

## 2013-06-05 NOTE — Progress Notes (Signed)
History: Patient returns 10 days following repair of extensive ventral hernia and diastases with panniculectomy and preperitoneal insertion of mesh. She has had some expected pain but controlled with medications and gradually improving. She is up and about without too much difficulty. No fever or chills. Tolerating diet and is having bowel movements.  Exam: BP 116/78  Pulse 60  Temp(Src) 97.4 F (36.3 C) (Temporal)  Resp 16  Ht 5' 1"  (1.549 m)  Wt 122 lb 9.6 oz (55.611 kg)  BMI 23.18 kg/m2  LMP 05/08/2013 General: Appears well Abdomen: Incision healing nicely without erythema or drainage. Mild abdominal bloating. JP drain in place with serosanguineous drainage measuring only 5 cc over the past 24 hours and this was removed.  Assessment and plan: Doing well following extensive ventral hernia repair and panniculectomy as above. I renewed her Percocet. Activity limitations were discussed. Return in one week for staple removal.

## 2013-06-12 ENCOUNTER — Ambulatory Visit (INDEPENDENT_AMBULATORY_CARE_PROVIDER_SITE_OTHER): Payer: 59

## 2013-06-12 ENCOUNTER — Encounter (INDEPENDENT_AMBULATORY_CARE_PROVIDER_SITE_OTHER): Payer: Self-pay

## 2013-06-12 VITALS — BP 112/80 | HR 71 | Temp 97.5°F | Resp 16 | Ht 61.0 in | Wt 120.4 lb

## 2013-06-12 DIAGNOSIS — Z4802 Encounter for removal of sutures: Secondary | ICD-10-CM

## 2013-06-12 NOTE — Progress Notes (Signed)
Patient comes into office today for staple removal.  Patient incision appears to be healing well without redness, swelling or drainage.  Proceeded to remove staples, incision intact.  Placed steri-strips over incision.  Patient tolerated well.  Patient given post op appointment date.  Patient advised to call our office if she has any questions or concerns.

## 2013-06-19 ENCOUNTER — Encounter (INDEPENDENT_AMBULATORY_CARE_PROVIDER_SITE_OTHER): Payer: Self-pay | Admitting: General Surgery

## 2013-06-19 ENCOUNTER — Ambulatory Visit (INDEPENDENT_AMBULATORY_CARE_PROVIDER_SITE_OTHER): Payer: 59 | Admitting: General Surgery

## 2013-06-19 VITALS — BP 122/70 | HR 66 | Temp 99.2°F | Resp 16 | Ht 64.0 in | Wt 118.4 lb

## 2013-06-19 DIAGNOSIS — K439 Ventral hernia without obstruction or gangrene: Secondary | ICD-10-CM

## 2013-06-19 NOTE — Progress Notes (Signed)
History: The patient returns for followup now one month following extensive ventral hernia repair with panniculectomy. She is feeling significantly better. Still feels a fair amount of tightness but no major pain and she feels that the wound is less swollen.  Exam: BP 122/70  Pulse 66  Temp(Src) 99.2 F (37.3 C) (Temporal)  Resp 16  Ht 5' 4"  (1.626 m)  Wt 118 lb 6.4 oz (53.706 kg)  BMI 20.31 kg/m2  LMP 05/08/2013 Abdomen: Incision is healing nicely without seroma or other complication in the repair feels solid.  Assessment plan: Doing well without complication. She is to return to work in about 2 weeks. I will see her in 2 months for which should be of final long-term check.

## 2013-08-14 ENCOUNTER — Encounter (INDEPENDENT_AMBULATORY_CARE_PROVIDER_SITE_OTHER): Payer: Self-pay | Admitting: General Surgery

## 2013-08-14 ENCOUNTER — Ambulatory Visit (INDEPENDENT_AMBULATORY_CARE_PROVIDER_SITE_OTHER): Payer: 59 | Admitting: General Surgery

## 2013-08-14 VITALS — BP 108/76 | HR 71 | Temp 98.2°F | Resp 16 | Wt 115.4 lb

## 2013-08-14 DIAGNOSIS — Z09 Encounter for follow-up examination after completed treatment for conditions other than malignant neoplasm: Secondary | ICD-10-CM

## 2013-08-14 NOTE — Progress Notes (Signed)
History: Patient returns for followup now over over 2 months following extensive ventral hernia repair with panniculectomy. She is getting along well. She is back at full activity but has not been exercising. Denies any abdominal or wound complaints and. Is very pleased with the result.  Exam: BP 108/76  Pulse 71  Temp(Src) 98.2 F (36.8 C) (Temporal)  Resp 16  Wt 115 lb 6.4 oz (52.345 kg) General: Appears well Abdomen: Well-healed transverse incision. Abdominal wall is intact without hernia or weakness.  Assessment and plan: Doing very well following the above procedure. She was released to return to full activity. I encouraged her to call me as needed for any concerns such as pain or swelling.

## 2013-10-08 IMAGING — US US TRANSVAGINAL NON-OB
1 series · 14 of 25 positions shown · non-contrast
Comparison: None

CLINICAL DATA: Question of a pelvic mass on physical exam

TRANSABDOMINAL AND TRANSVAGINAL ULTRASOUND OF PELVIS
TECHNIQUE: Both transabdominal and transvaginal ultrasound
examinations of the pelvis were performed including evaluation of
the uterus, ovaries, adnexal regions, and pelvic cul-de-sac.

[Series 1: us transvaginal non-ob · 0.31mm/px · 14 of 61 slices shown]
[im 1/61]
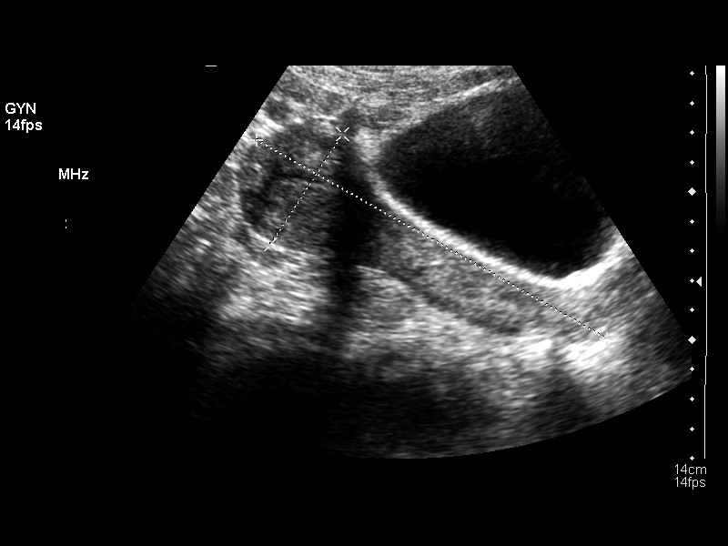
[im 6/61]
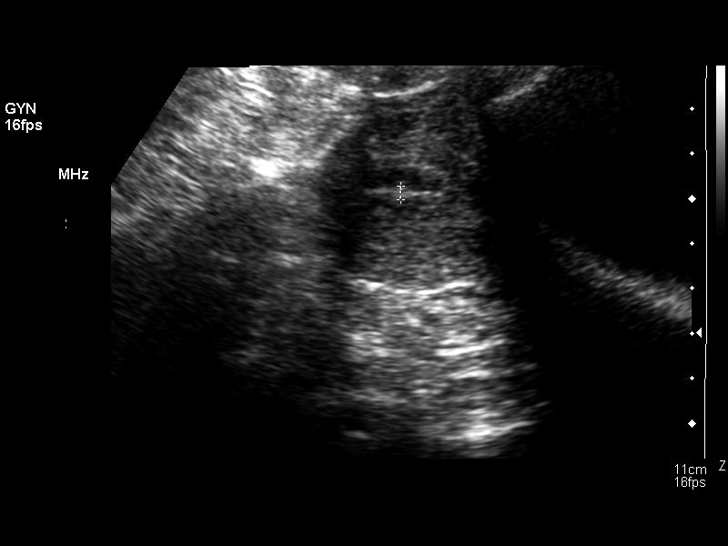
[im 11/61]
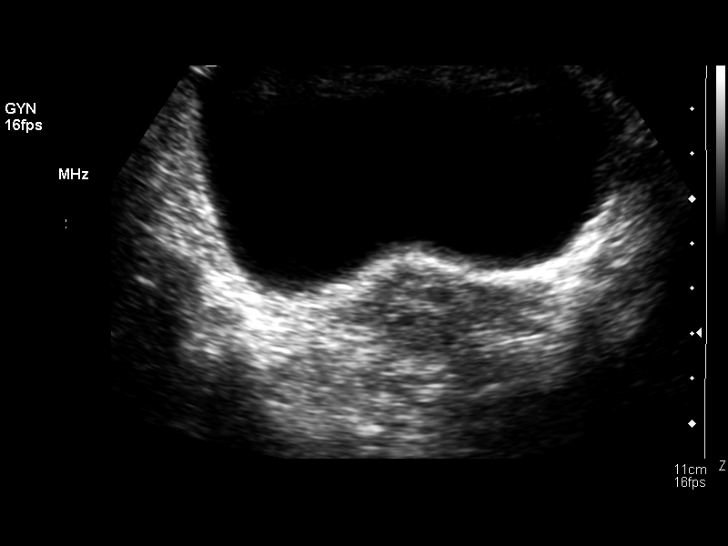
[im 16/61]
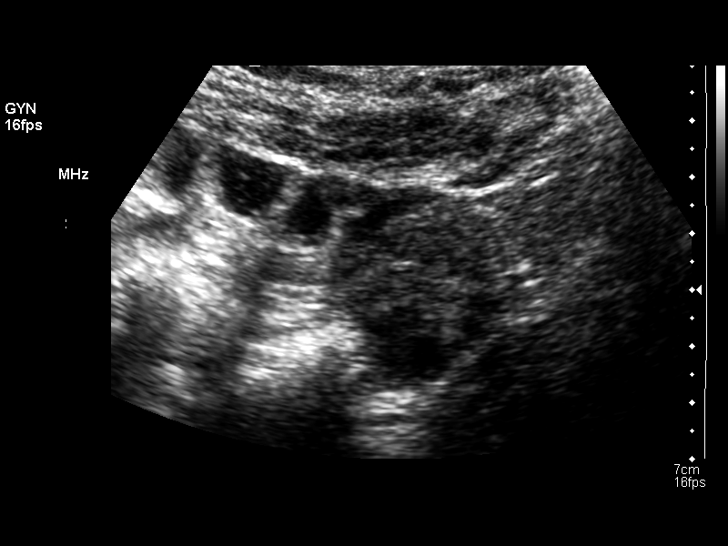
[im 21/61]
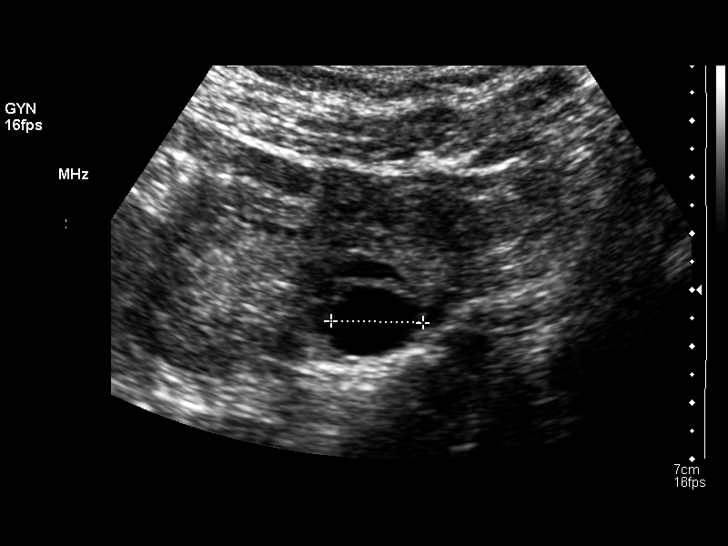
[im 23/61]
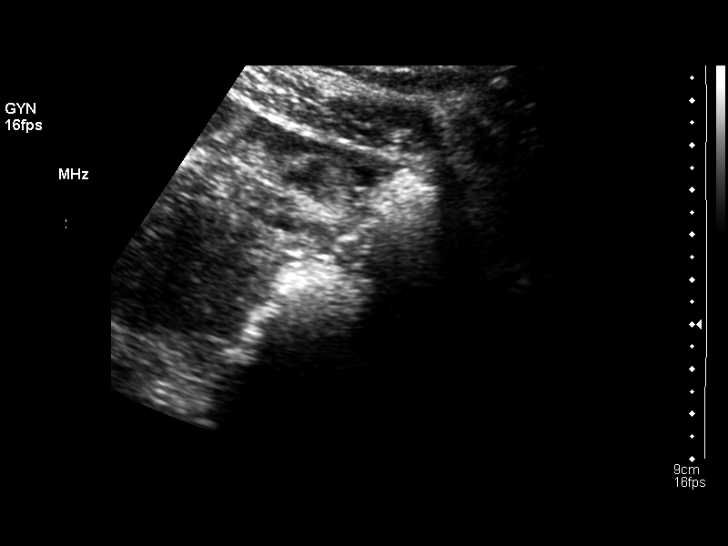
[im 28/61]
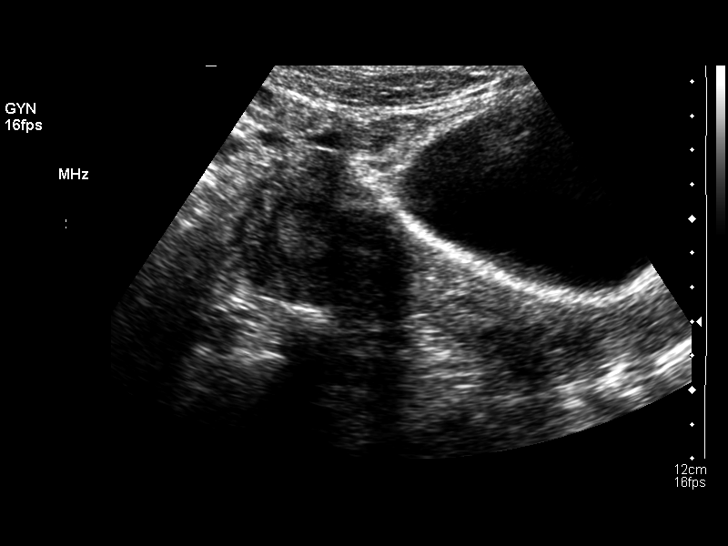
[im 33/61]
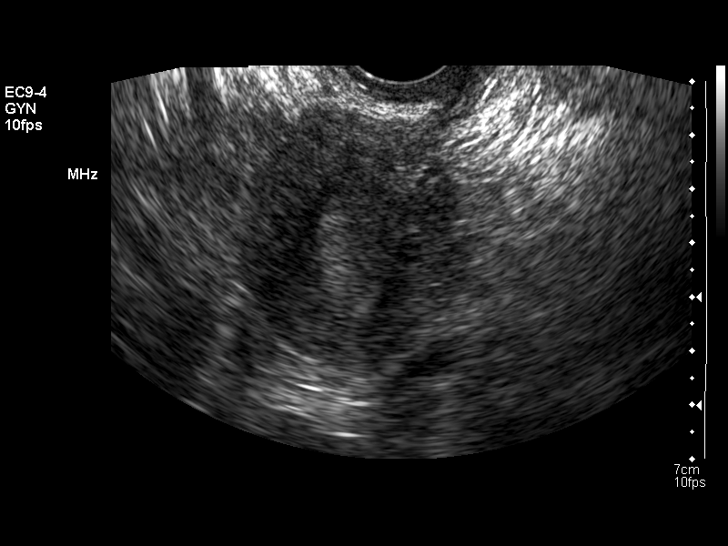
[im 38/61]
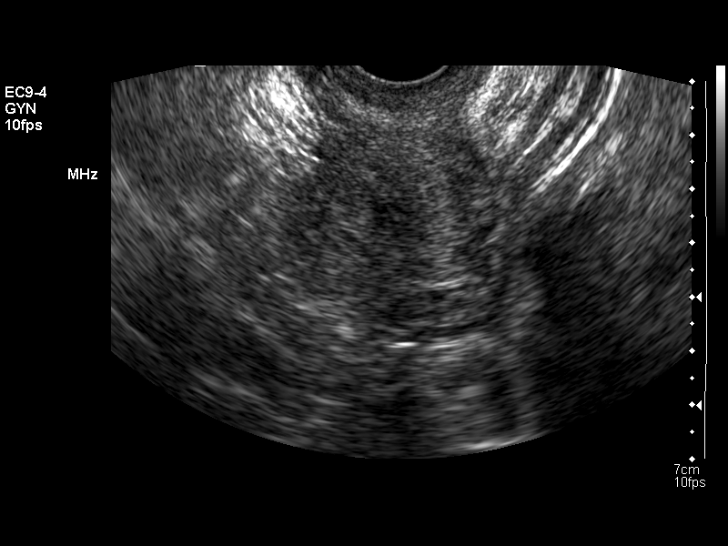
[im 41/61]
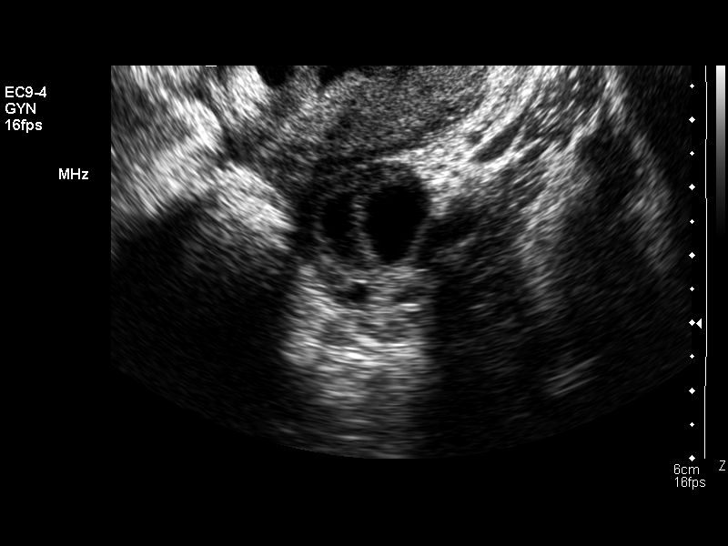
[im 46/61]
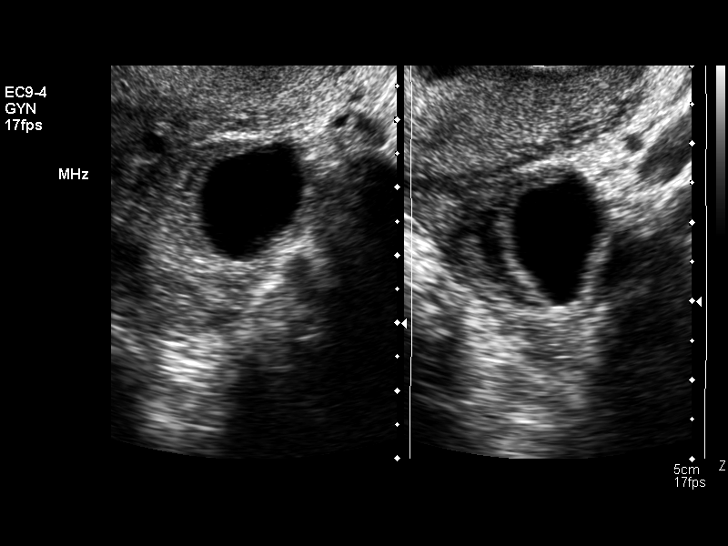
[im 51/61]
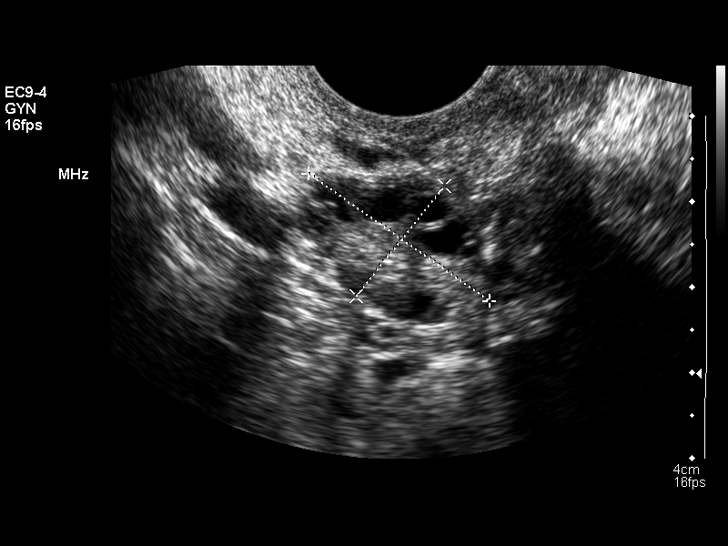
[im 56/61]
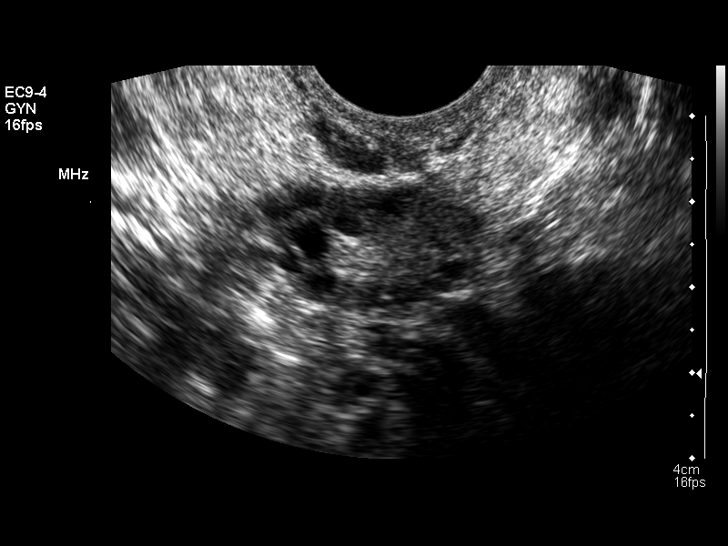
[im 61/61]
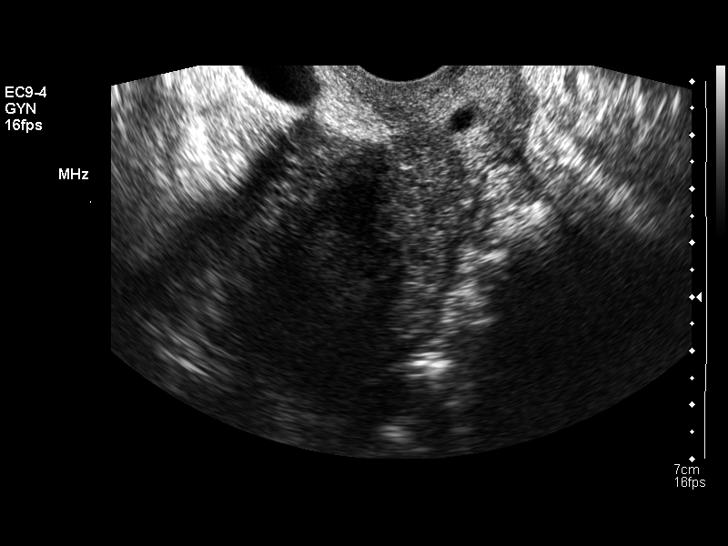

[14 of 25 positions shown; findings below may reference images not displayed]

FINDINGS: Uterus: The uterus measures 8.7 cm sagittally, with a depth of
cm and width of 4.8 cm.  No myometrial abnormality is seen.

Endometrium:The endometrium measures 12.3 mm in diameter, which is
within normal limits.

Right Ovary :The right ovary measures 2.6 x 1.7 x 2.9 cm with small
follicles.

Left Ovary :The left ovary measures 2.9 x 2.3 x 3.8 cm also with
small follicles.

Other Findings:  No free fluid is seen.
IMPRESSION: 1.  No pelvic mass is seen.  The uterus is normal in size.
2.  Bilateral ovarian follicles.

## 2015-03-16 IMAGING — US US ABDOMEN COMPLETE
1 series · 13 of 25 positions shown · non-contrast
Comparison: None.

CLINICAL DATA: Central abdominal pain.

EXAM:
ULTRASOUND ABDOMEN COMPLETE

[Series 1: us abdomen complete · 0.32mm/px · 13 of 90 slices shown]
[im 1/90]
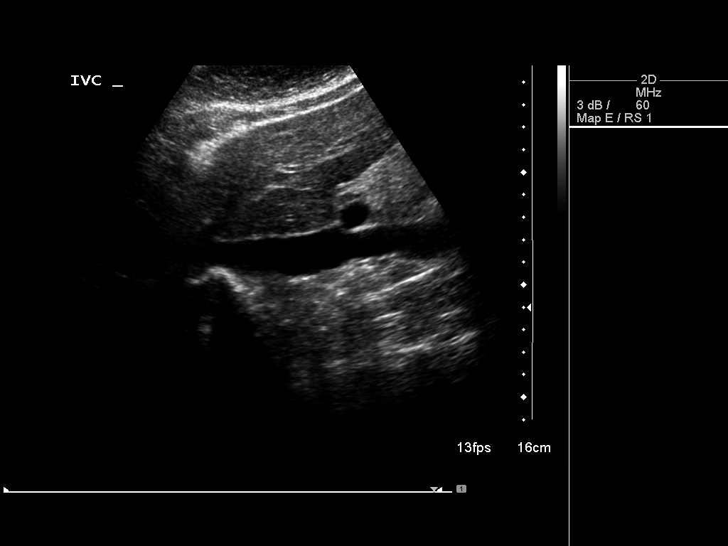
[im 8/90]
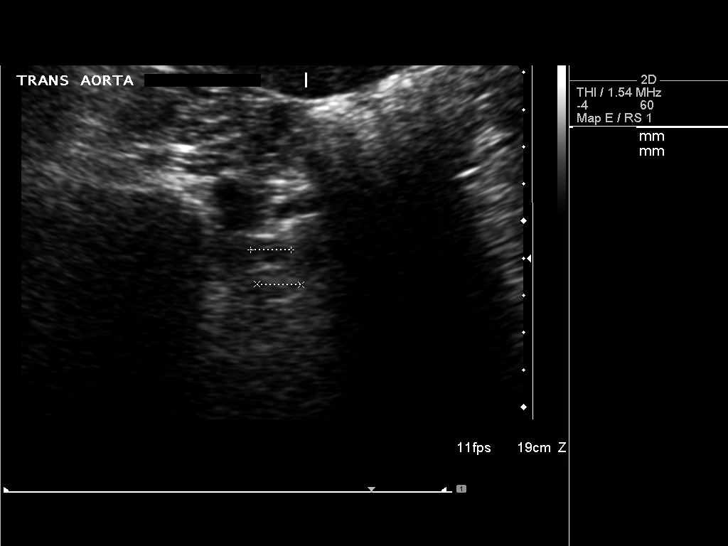
[im 15/90]
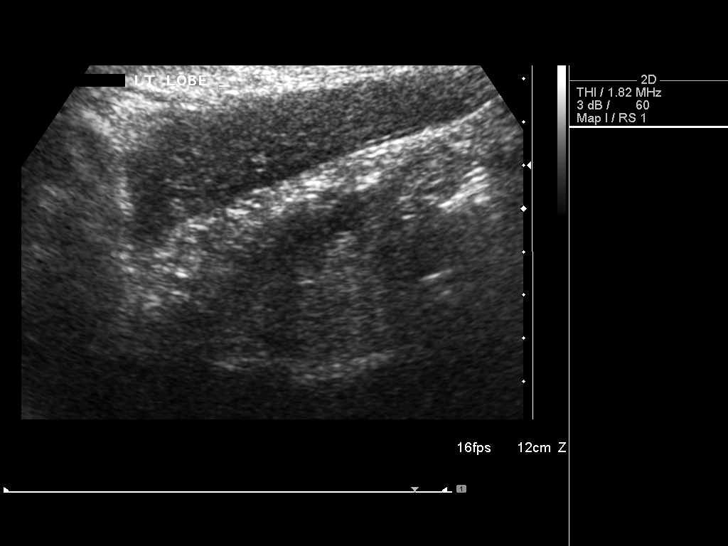
[im 23/90]
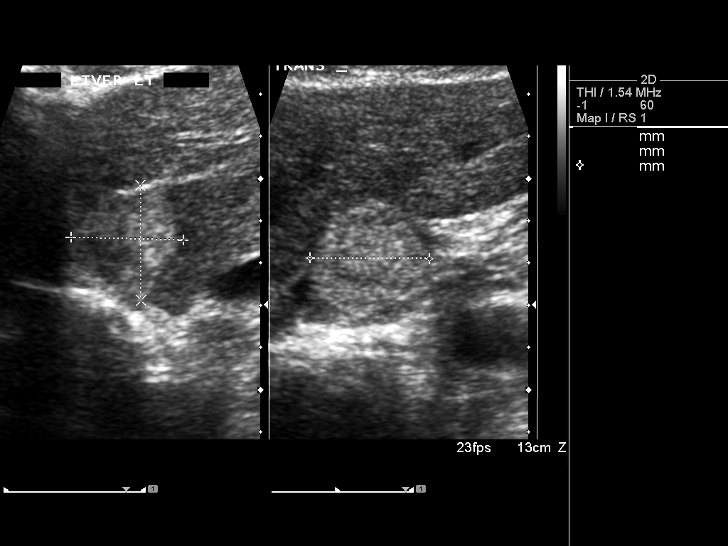
[im 30/90]
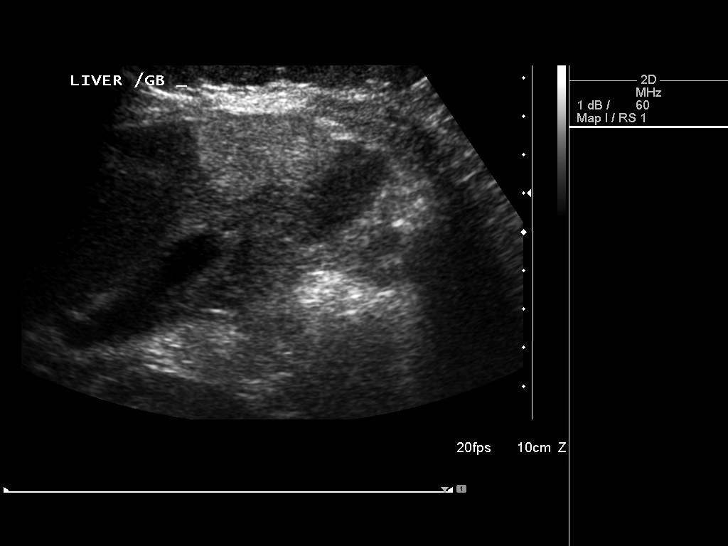
[im 38/90]
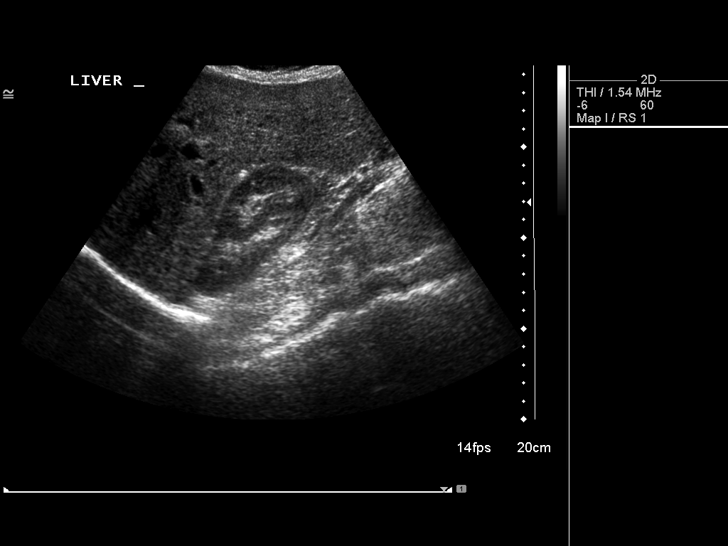
[im 45/90]
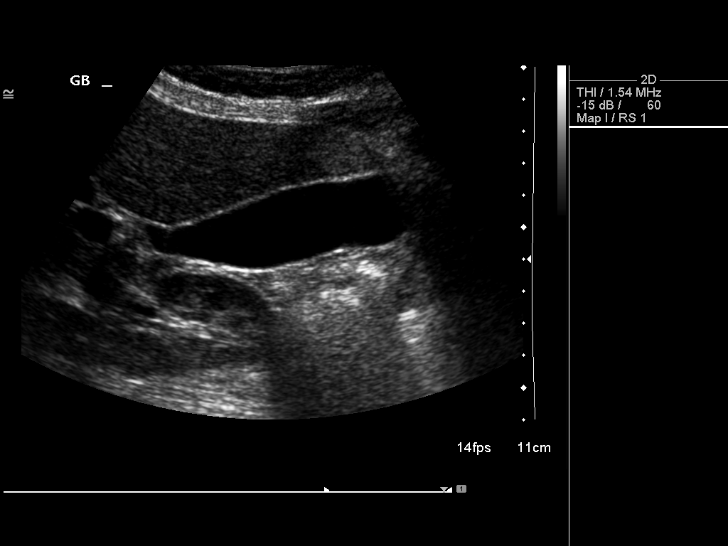
[im 52/90]
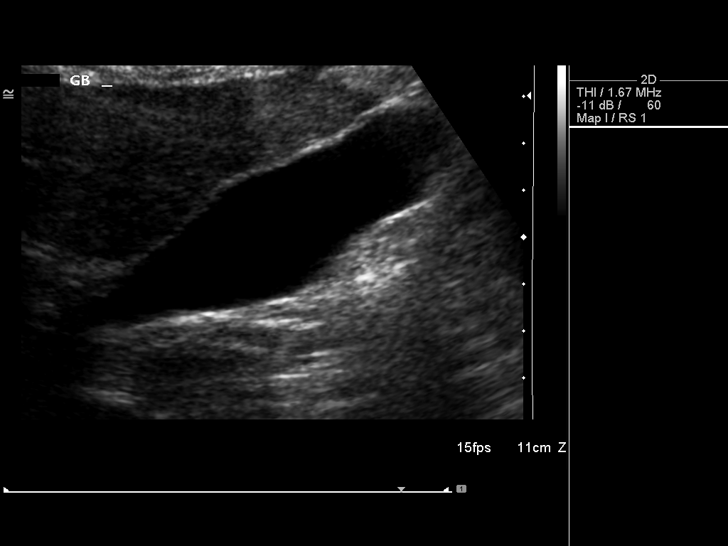
[im 60/90]
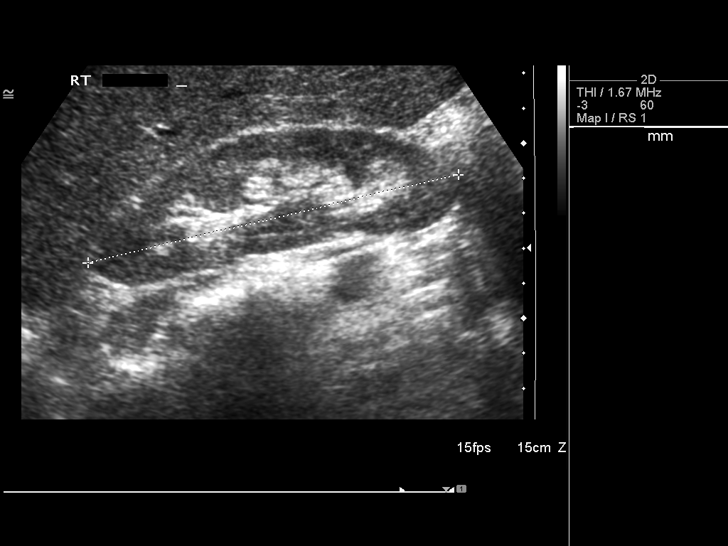
[im 67/90]
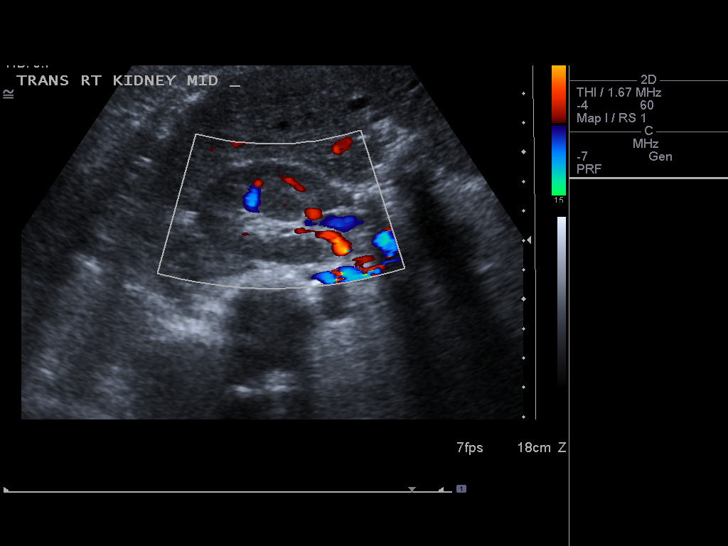
[im 75/90]
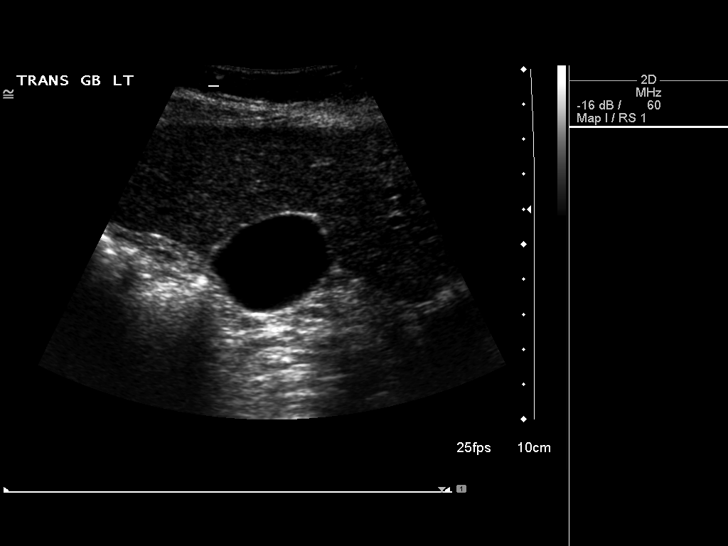
[im 82/90]
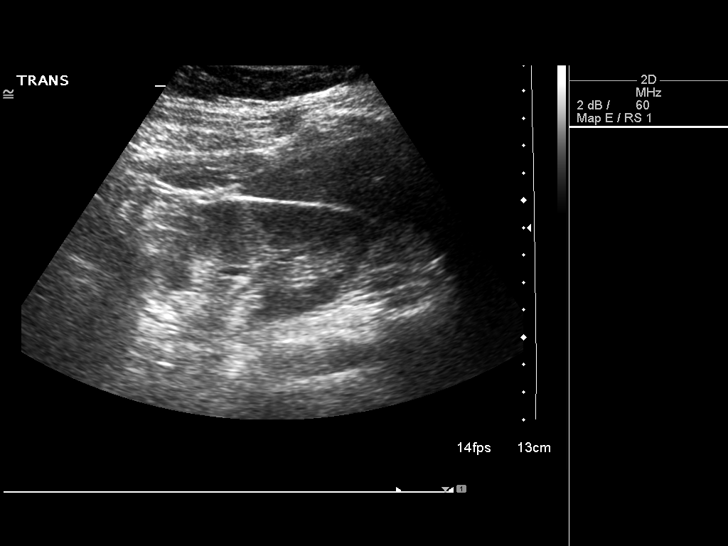
[im 90/90]
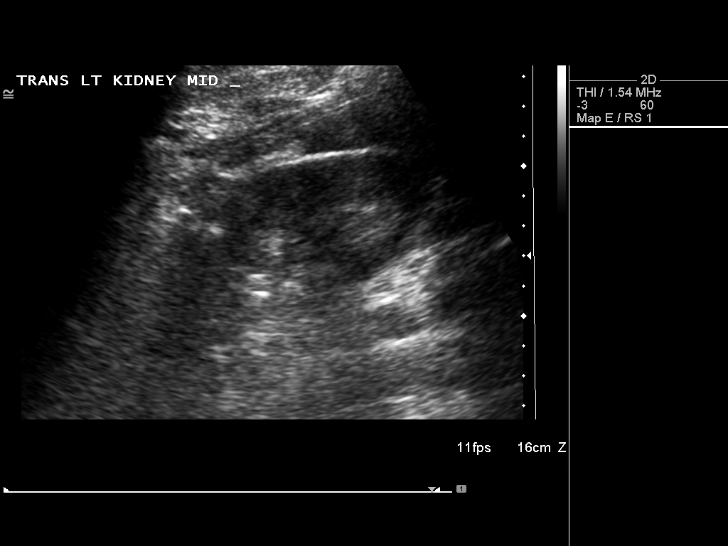

[13 of 25 positions shown; findings below may reference images not displayed]

FINDINGS: Gallbladder:

No gallstones or wall thickening visualized. No sonographic Murphy
sign noted.

Common bile duct:

Diameter: Normal caliber, 3 mm.

Liver:

Hyperechoic lesion posteriorly in the left hepatic lobe measuring
2.8 x 2.7 x 2.7 cm. Hyperechoic lesion in the right hepatic lobe
inferiorly measures 4.1 x 3.9 x 2.3 cm. Imaging characteristics
suggest hemangiomas. No biliary ductal dilatation.

IVC:

No abnormality visualized.

Pancreas:

Visualized portion unremarkable.

Spleen:

Size and appearance within normal limits.

Right Kidney:

Length: 10.9 cm. Echogenicity within normal limits. No mass or
hydronephrosis visualized.

Left Kidney:

Length: 11.5 cm. Echogenicity within normal limits. No mass or
hydronephrosis visualized.

Abdominal aorta:

No aneurysm visualized.

Other findings:

Probable anterior mid abdominal wall hernia measuring approximately
12 cm. There is shadowing in this area, likely from gas/ bowel.
IMPRESSION: Two hyperechoic lesions within the liver, the largest in the right
hepatic lobe measuring up to 4.1 cm. Imaging characteristics suggest
these represent benign hemangiomas. This could be confirmed with MRI
of the liver with a without contrast.

Anterior abdominal wall hernia of which appears to contain bowel.
